# Patient Record
Sex: Female | Born: 1985 | Race: White | Hispanic: No | Marital: Married | Smoking: Never smoker
Health system: Southern US, Community
[De-identification: ages and names within clinical notes are randomized; demographics above are authoritative.]

## PROBLEM LIST (undated history)

## (undated) DIAGNOSIS — Z1501 Genetic susceptibility to malignant neoplasm of breast: Secondary | ICD-10-CM

## (undated) DIAGNOSIS — F419 Anxiety disorder, unspecified: Secondary | ICD-10-CM

## (undated) DIAGNOSIS — Z1509 Genetic susceptibility to other malignant neoplasm: Secondary | ICD-10-CM

## (undated) DIAGNOSIS — N943 Premenstrual tension syndrome: Secondary | ICD-10-CM

## (undated) DIAGNOSIS — F32A Depression, unspecified: Secondary | ICD-10-CM

## (undated) DIAGNOSIS — Z3490 Encounter for supervision of normal pregnancy, unspecified, unspecified trimester: Secondary | ICD-10-CM

## (undated) DIAGNOSIS — A630 Anogenital (venereal) warts: Principal | ICD-10-CM

## (undated) HISTORY — PX: BREAST BIOPSY: SHX20

## (undated) HISTORY — DX: Anxiety disorder, unspecified: F41.9

## (undated) HISTORY — DX: Premenstrual tension syndrome: N94.3

## (undated) HISTORY — PX: WISDOM TOOTH EXTRACTION: SHX21

## (undated) HISTORY — DX: Genetic susceptibility to other malignant neoplasm: Z15.09

## (undated) HISTORY — DX: Encounter for supervision of normal pregnancy, unspecified, unspecified trimester: Z34.90

## (undated) HISTORY — DX: Anogenital (venereal) warts: A63.0

## (undated) HISTORY — DX: Genetic susceptibility to malignant neoplasm of breast: Z15.01

## (undated) HISTORY — PX: OTHER SURGICAL HISTORY: SHX169

---
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
## 2005-01-19 ENCOUNTER — Other Ambulatory Visit: Admission: RE | Admit: 2005-01-19 | Discharge: 2005-01-19 | Payer: Self-pay | Admitting: Gynecology

## 2006-01-22 ENCOUNTER — Other Ambulatory Visit: Admission: RE | Admit: 2006-01-22 | Discharge: 2006-01-22 | Payer: Self-pay | Admitting: Gynecology

## 2007-02-11 ENCOUNTER — Other Ambulatory Visit: Admission: RE | Admit: 2007-02-11 | Discharge: 2007-02-11 | Payer: Self-pay | Admitting: Gynecology

## 2008-02-21 ENCOUNTER — Other Ambulatory Visit: Admission: RE | Admit: 2008-02-21 | Discharge: 2008-02-21 | Payer: Self-pay | Admitting: Gynecology

## 2009-03-11 ENCOUNTER — Other Ambulatory Visit: Admission: RE | Admit: 2009-03-11 | Discharge: 2009-03-11 | Payer: Self-pay | Admitting: Gynecology

## 2009-03-11 ENCOUNTER — Ambulatory Visit: Payer: Self-pay | Admitting: Women's Health

## 2009-03-11 ENCOUNTER — Encounter: Payer: Self-pay | Admitting: Women's Health

## 2009-06-17 ENCOUNTER — Ambulatory Visit: Payer: Self-pay | Admitting: Women's Health

## 2010-03-14 ENCOUNTER — Other Ambulatory Visit: Admission: RE | Admit: 2010-03-14 | Discharge: 2010-03-14 | Payer: Self-pay | Admitting: Gynecology

## 2010-03-14 ENCOUNTER — Ambulatory Visit: Payer: Self-pay | Admitting: Women's Health

## 2011-03-26 ENCOUNTER — Ambulatory Visit (INDEPENDENT_AMBULATORY_CARE_PROVIDER_SITE_OTHER): Payer: BC Managed Care – PPO | Admitting: Women's Health

## 2011-03-26 DIAGNOSIS — N912 Amenorrhea, unspecified: Secondary | ICD-10-CM

## 2011-05-15 ENCOUNTER — Other Ambulatory Visit: Payer: Self-pay | Admitting: Women's Health

## 2011-05-15 ENCOUNTER — Other Ambulatory Visit (HOSPITAL_COMMUNITY)
Admission: RE | Admit: 2011-05-15 | Discharge: 2011-05-15 | Disposition: A | Discharge status: Home / Self Care | Payer: BC Managed Care – PPO | Source: Ambulatory Visit | Attending: Gynecology | Admitting: Gynecology

## 2011-05-15 ENCOUNTER — Encounter (INDEPENDENT_AMBULATORY_CARE_PROVIDER_SITE_OTHER): Payer: BC Managed Care – PPO | Admitting: Women's Health

## 2011-05-15 DIAGNOSIS — Z124 Encounter for screening for malignant neoplasm of cervix: Secondary | ICD-10-CM | POA: Insufficient documentation

## 2011-05-15 DIAGNOSIS — Z01419 Encounter for gynecological examination (general) (routine) without abnormal findings: Secondary | ICD-10-CM

## 2011-05-15 DIAGNOSIS — Z3049 Encounter for surveillance of other contraceptives: Secondary | ICD-10-CM

## 2011-09-29 DIAGNOSIS — N943 Premenstrual tension syndrome: Secondary | ICD-10-CM | POA: Insufficient documentation

## 2011-09-29 DIAGNOSIS — G43909 Migraine, unspecified, not intractable, without status migrainosus: Secondary | ICD-10-CM | POA: Insufficient documentation

## 2011-10-02 ENCOUNTER — Encounter: Payer: Self-pay | Admitting: Women's Health

## 2011-10-02 ENCOUNTER — Ambulatory Visit (INDEPENDENT_AMBULATORY_CARE_PROVIDER_SITE_OTHER): Payer: BC Managed Care – PPO | Admitting: Women's Health

## 2011-10-02 DIAGNOSIS — IMO0001 Reserved for inherently not codable concepts without codable children: Secondary | ICD-10-CM

## 2011-10-02 DIAGNOSIS — N390 Urinary tract infection, site not specified: Secondary | ICD-10-CM

## 2011-10-02 DIAGNOSIS — R350 Frequency of micturition: Secondary | ICD-10-CM

## 2011-10-02 MED ORDER — SULFAMETHOXAZOLE-TRIMETHOPRIM 800-160 MG PO TABS: 1.0000 | ORAL_TABLET | Freq: Two times a day (BID) | ORAL | Status: AC

## 2011-10-02 NOTE — Progress Notes (Signed)
  Presents with a complaint of increased urinary frequency, urgency and slight pain at the end of the stream of urination. Denies any discharge or fever. Also would like to become pregnant. No contraception since May. Fertility awareness was reviewed, reviewed importance of increasing frequency, rubella immune. She is on MVI, as is aware of safe behavior in pregnancy.  UA- TNTC WBCs.  History of a normal TSH and prolactin. 28-30 day cycle for 5 days  Plan: Septra DS one by mouth twice a day for 3 days, UTI prevention discussed. Return to office with missed cycle for viability ultrasound, if does not conceive in next 2-3 months check SA, if normal hysterosalpingogram.

## 2011-12-07 ENCOUNTER — Telehealth: Payer: Self-pay | Admitting: *Deleted

## 2011-12-07 DIAGNOSIS — Z3169 Encounter for other general counseling and advice on procreation: Secondary | ICD-10-CM

## 2011-12-07 NOTE — Telephone Encounter (Signed)
Telephone call, reviewed plan of action. Will check a estradiol, prolactin, FSH, on day 3 of next cycle will call office to schedule. Will also check a progesterone level on day 22 -25. Will also get a  SA./ Semen analysis.  Jen-please put above labs in computer. Thanks

## 2011-12-07 NOTE — Telephone Encounter (Signed)
Pt called saying that she has not conceived yet. Pt was last seen on 10/02/11 and was told if not pregnant in 2-3 months husband will be checked for SA? Should we set up for infertility? Please advise

## 2011-12-07 NOTE — Telephone Encounter (Signed)
Telephone call left message we will do lab work Day  3 of cycle- estradiol, FSH and prolactin she does have a normal TSH. Also we'll do a progesterone level day 22-25 of her cycle. Instructed to call so we can get  Scheduled. if this is normal we'll proceed with a SA.

## 2011-12-07 NOTE — Telephone Encounter (Signed)
Pt called saying that she has not conceived yet. Pt was last seen on 10/02/11 and was told if not pregnant in 2-3 months husband will be checked for SA? Should we set up for infertility? Please advise 

## 2011-12-08 NOTE — Telephone Encounter (Signed)
Addended by: Aura Camps on: 12/08/2011 08:38 AM   Modules accepted: Orders

## 2011-12-28 ENCOUNTER — Telehealth: Payer: Self-pay | Admitting: *Deleted

## 2011-12-28 NOTE — Telephone Encounter (Signed)
Telephone call, last cycle started on Thursday, we'll wait until next month and come in on day 3 for an estradiol and FSH. We'll pick up kit and schedule appointment for her husband to get a SA done at [facility] on [address].

## 2011-12-28 NOTE — Telephone Encounter (Signed)
(  see telephone encounter 12/07/11) Pt was to call on day 3 of next cycle which came on Friday 12/25/11. But pt had only spotting, her full flow was on Saturday but today she has light bleeding again, no flow. Pt is okay with waiting until next cycle. She also would like to know if her husband should still get his S.A/Semen Analysis done or wait until her cycle comes next month? Please advise

## 2012-01-21 ENCOUNTER — Telehealth: Payer: Self-pay | Admitting: *Deleted

## 2012-01-21 NOTE — Telephone Encounter (Signed)
(  F/U from telephone encounter 12/02/11) pt called and said that her period started yesterday and Friday will be there 3 rd. She is not having a heavy flow but it is very light not spotting but very light. She states this is the time for her period and normally her period would become somewhat  heavier on her 3 rd day of cycle. Pt said she thinks this may happen every month with her periods. Should pt come to have labs drawn as requested? Please advise

## 2012-01-21 NOTE — Telephone Encounter (Signed)
Telephone call, day 3 of bleeding will come to office on 01/22/12 for [facility] and estradiol.

## 2012-01-21 NOTE — Telephone Encounter (Signed)
Pt called stating that her day 3 period will be on Friday. Pt wanted to to know if okay to have labs drawn per telephone encounter 12/28/11 okay to do this. Left message on pt vm to make appointment to have labs drawn.

## 2012-01-22 ENCOUNTER — Other Ambulatory Visit: Payer: BC Managed Care – PPO

## 2012-01-22 DIAGNOSIS — Z3169 Encounter for other general counseling and advice on procreation: Secondary | ICD-10-CM

## 2012-01-22 LAB — ESTRADIOL, FREE

## 2012-02-03 ENCOUNTER — Encounter: Payer: Self-pay | Admitting: Women's Health

## 2012-02-12 ENCOUNTER — Other Ambulatory Visit: Payer: Self-pay | Admitting: *Deleted

## 2012-02-12 ENCOUNTER — Other Ambulatory Visit: Payer: BC Managed Care – PPO

## 2012-02-12 DIAGNOSIS — N926 Irregular menstruation, unspecified: Secondary | ICD-10-CM

## 2012-02-13 LAB — PROGESTERONE: Progesterone: 8.6 ng/mL

## 2012-02-17 ENCOUNTER — Encounter: Payer: Self-pay | Admitting: Women's Health

## 2012-02-18 ENCOUNTER — Other Ambulatory Visit: Payer: Self-pay | Admitting: Women's Health

## 2012-02-18 NOTE — Progress Notes (Signed)
Telephone call to review semen analysis, informed results similar to first semen analysis with low sperm count, low motility, encouraged urology consult for husband. Will also schedule appointment with Dr. April Manson for fertility management. Instructed to call when appointment is made so lab results can be faxed.

## 2012-04-20 ENCOUNTER — Telehealth: Payer: Self-pay | Admitting: *Deleted

## 2012-04-20 NOTE — Telephone Encounter (Signed)
Pt saw dr Dyke Brackett and her request pt have fsh, amh, cystic fibrosis lab drawn at [facility], pt asked if labs could be done here. Pt informed that we use [facility] as well.

## 2012-05-18 ENCOUNTER — Encounter: Payer: Self-pay | Admitting: Women's Health

## 2012-05-18 ENCOUNTER — Ambulatory Visit (INDEPENDENT_AMBULATORY_CARE_PROVIDER_SITE_OTHER): Payer: BC Managed Care – PPO | Admitting: Women's Health

## 2012-05-18 VITALS — BP 150/94 | Ht 67.0 in | Wt 154.0 lb

## 2012-05-18 DIAGNOSIS — N469 Male infertility, unspecified: Secondary | ICD-10-CM | POA: Insufficient documentation

## 2012-05-18 DIAGNOSIS — Z01419 Encounter for gynecological examination (general) (routine) without abnormal findings: Secondary | ICD-10-CM

## 2012-05-18 DIAGNOSIS — N979 Female infertility, unspecified: Secondary | ICD-10-CM

## 2012-05-18 MED ORDER — ALPRAZOLAM 0.25 MG PO TABS: 0.2500 mg | ORAL_TABLET | Freq: Every evening | ORAL | Status: DC | PRN

## 2012-05-18 NOTE — Progress Notes (Signed)
Leah Davis [birth year] [id number]    History:    The patient presents for annual exam.  Monthly 3-4 day cycle desiring conception. Husband with low sperm count and low motility. Is being seen by Dr. April Manson for fertility management. History of normal Paps. Gardasil series completed in 2007. Rubella +2012. History of anxiety, uses Xanax 0.25 on rare occasion. History of migraines without aura.  Past medical history, past surgical history, family history and social history were all reviewed and documented in the EPIC chart. Mother breast cancer in her 66s doing well maternal grandmother breast cancer 26s, BRCA testing reviewed, unsure if mother had done.  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   ROS:  A  ROS was performed and pertinent positives and negatives are included in the history.  Exam:  Filed Vitals:   05/18/12 0956  BP: 150/94    General appearance:  Normal Head/Neck:  Normal, without cervical or supraclavicular adenopathy. Thyroid:  Symmetrical, normal in size, without palpable masses or nodularity. Respiratory  Effort:  Normal  Auscultation:  Clear without wheezing or rhonchi Cardiovascular  Auscultation:  Regular rate, without rubs, murmurs or gallops  Edema/varicosities:  Not grossly evident Abdominal  Soft,nontender, without masses, guarding or rebound.  Liver/spleen:  No organomegaly noted  Hernia:  None appreciated  Skin  Inspection:  Grossly normal  Palpation:  Grossly normal Neurologic/psychiatric  Orientation:  Normal with appropriate conversation.  Mood/affect:  Normal  Genitourinary    Breasts: Examined lying and sitting.     Right: Without masses, retractions, discharge or axillary adenopathy.     Left: Without masses, retractions, discharge or axillary adenopathy.   Inguinal/mons:  Normal without inguinal adenopathy  External genitalia:  Normal  BUS/Urethra/Skene's glands:  Normal  Bladder:  Normal  Vagina:  Normal  Cervix:  Normal  Uterus:    normal in size, shape and contour.  Midline and mobile  Adnexa/parametria:     Rt: Without masses or tenderness.   Lt: Without masses or tenderness.  Anus and perineum: Normal  Digital rectal exam: Normal sphincter tone without palpated masses or tenderness  Assessment/Plan:  26 y.o. M. WF GC for annual exam.     Infertility-Dr. April Manson Situational anxiety  Plan: Continue followup for fertility management, prenatal vitamin daily, SBE's, starting annual mammogram at 26. Exercise, safe behaviors in pregnancy reviewed. Had numerous labs drawn for fertility, no labs today history of all normal Paps, no Pap done, new Pap screening recommendations reviewed. Xanax 0.25 at bedtime when necessary ,#30, reviewed not to use with pregnancy. Uses rarely, is aware of addictive properties.    Harrington Challenger WHNP, 1:00 PM 05/18/2012

## 2012-05-24 ENCOUNTER — Telehealth: Payer: Self-pay

## 2012-05-24 NOTE — Telephone Encounter (Signed)
PT. CALLED BACK A SECOND TIME AND STATES NOW MOM IS GETTING BRACA TESTING AND Lachlan WANTS TO KNOW IF SHE NEEDS TO DO BRACA TESTING AFTER MOM GETS HER RESULTS CAN SHE GET IT DONE AT OUR OFFICE.

## 2012-05-24 NOTE — Telephone Encounter (Signed)
AFTER SEEING YOU RECENTLY SHE DECIDED NOT TO SEE A M.D. AT [facility] DUE TO HAVING TO TAKE TOO MUCH TIME OFF AND WANTED TO KNOW IF SHE COULD HAVE BRACA TESTING FIRST SO HER MOM WON'T HAVE TO PAY FOR THE TESTING. SHE ALSO STATES DR. YACINKAYA DIDN'T CARE WHERE SHE HAD TESTS DONE BUT JUST AS LONG AS SHE GETS TESTED.

## 2012-05-25 NOTE — Telephone Encounter (Signed)
Pt informed with the below note, pt will have dr. April Manson office have her [facility] testing done.

## 2012-05-25 NOTE — Telephone Encounter (Signed)
Please call, After mother has BRCA testing results, review with Dr. April Manson. BRCA testing can be done here. Remind her she needs a lab appointment.

## 2012-05-30 DIAGNOSIS — A630 Anogenital (venereal) warts: Secondary | ICD-10-CM

## 2012-05-30 HISTORY — DX: Anogenital (venereal) warts: A63.0

## 2012-06-09 ENCOUNTER — Telehealth: Payer: Self-pay | Admitting: *Deleted

## 2012-06-09 NOTE — Telephone Encounter (Signed)
Pt called saying skin tag has changed and became somewhat bigger, left message on voicemail nancy out of office until 04/15/12 call back next week to make ov to check.

## 2012-06-10 ENCOUNTER — Ambulatory Visit (INDEPENDENT_AMBULATORY_CARE_PROVIDER_SITE_OTHER): Payer: BC Managed Care – PPO | Admitting: Gynecology

## 2012-06-10 ENCOUNTER — Encounter: Payer: Self-pay | Admitting: Gynecology

## 2012-06-10 DIAGNOSIS — N9489 Other specified conditions associated with female genital organs and menstrual cycle: Secondary | ICD-10-CM

## 2012-06-10 DIAGNOSIS — N9089 Other specified noninflammatory disorders of vulva and perineum: Secondary | ICD-10-CM

## 2012-06-10 NOTE — Patient Instructions (Signed)
Office will call you with pathology results. Call us if you have any issues.

## 2012-06-10 NOTE — Progress Notes (Signed)
Patient ID: Leah Davis, female   DOB: [birth year], 26 y.o.   MRN: [id number] Patient presents with history of right vulvar skin lesion. Has been present for a long time seems to get bigger and smaller. Not overly bothersome.  Exam with Sherrilyn Rist assistant External BUS vagina with pedunculated mid right labial epithelial polyp benign in appearance. Remainder of vulva normal. Vagina without lesions. Cervix normal. Uterus normal size midline mobile nontender. Adnexa without masses or tenderness. Physical Exam  Genitourinary:      Discussed observation versus excision and the patient prefers to have the area excised.  Procedure:  Surrounding/overlying skin cleansed with Betadine solution, infiltrated with 1% lidocaine and the lesion was excised in its entirety at the level of surrounding labial skin and sent to pathology. Silver nitrate applied afterwards.  Assessment and plan: Persistent right labial pedunculated lesion, benign in appearance. Patient will follow up for pathology results. Assuming benign then we'll follow expectantly.

## 2012-06-14 ENCOUNTER — Telehealth: Payer: Self-pay | Admitting: Gynecology

## 2012-06-14 NOTE — Telephone Encounter (Signed)
Patient called back.  She had pap smear last year but when she came this year she did not because recommendation was for pap q 3 years because her pap smears have always been normal.  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

## 2012-06-14 NOTE — Telephone Encounter (Signed)
Patient was informed earlier this afternoon that her biopsy report showed "a benign condyloma" , "a benign wart".  She has called back and would like to know what causes it.  I told her caused by the HPV virus and usually sexually transmitted.  She asked "so I got this from my husband?".  At this point, I told her I wanted to be sure I answered things the proper way and gave her good info so to let me check with Dr. Velvet Bathe for how to answer these questions.

## 2012-06-14 NOTE — Telephone Encounter (Signed)
Patient informed. Reassured.

## 2012-06-14 NOTE — Telephone Encounter (Signed)
These are viral related as warts are on any part of the body. When they are genital they are usually sexually transmitted which could have occurred years ago and do not necessarily imply recent exposure. There is no way to tell when exposure occurred. Nothing further needs to be done other than if other bumps appear to be reevaluated. On study where asymptomatic, no warts college girls were checked for the virus, 70% had the virus. So it is very common.

## 2012-06-15 NOTE — Telephone Encounter (Signed)
Patient advised and was agreeable to this.  Appt scheduled for Friday, July 19 10:00am.

## 2012-06-15 NOTE — Telephone Encounter (Signed)
The patient has a lot of questions I think are better answered face-to-face. Have the patient make an appointment to come talk to me.

## 2012-06-15 NOTE — Telephone Encounter (Signed)
Patient had called back today because she had not heard from me back about her last question below regarding should she repeat pap smear now.  Also, since then she has been on the internet and she sees there are different types of the virus and she wonders what "brand" her's is?

## 2012-06-17 ENCOUNTER — Ambulatory Visit (INDEPENDENT_AMBULATORY_CARE_PROVIDER_SITE_OTHER): Payer: BC Managed Care – PPO | Admitting: Gynecology

## 2012-06-17 ENCOUNTER — Encounter: Payer: Self-pay | Admitting: Gynecology

## 2012-06-17 DIAGNOSIS — A630 Anogenital (venereal) warts: Secondary | ICD-10-CM

## 2012-06-17 NOTE — Progress Notes (Signed)
Patient presents to discuss her recent vulvar biopsy which showed condyloma acuminata. Patient had many questions about this and was asked to come in to discuss face-to-face. She had received the Gardasil vaccine.  Exam is consistent External with biopsy site healed. No other evidence of other lesions.  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 I reviewed with the patient it was not classic in appearance and the first possibility is that this was an overcall by the pathologist. Regardless would not change treatment which is monitoring at present. I reviewed the whole issue of condyloma acuminata, HPV relationship, natural history of HPV infection. She understands that this does not necessarily represent a recent exposure that this could have been present from prior exposures years ago. Recommend self exams and at any other lesions appear to come in for evaluation. Also that she should continue with routine Pap smears and that this should not change Pap smear screening frequency or type of screening.  She did receive the Gardasil vaccine but understands that it covers for subtypes and that there are many other subtypes the couldn't potentially cause infection. Patient will monitor and as long as no other lesions noted. She'll represent in a year for her annual exam.

## 2012-06-17 NOTE — Patient Instructions (Signed)
Call if you have any further questions. Otherwise follow up in one year for your annual exam.

## 2012-07-08 ENCOUNTER — Ambulatory Visit (HOSPITAL_BASED_OUTPATIENT_CLINIC_OR_DEPARTMENT_OTHER): Payer: BC Managed Care – PPO | Admitting: Genetic Counselor

## 2012-07-08 DIAGNOSIS — Z803 Family history of malignant neoplasm of breast: Secondary | ICD-10-CM

## 2012-07-08 DIAGNOSIS — IMO0002 Reserved for concepts with insufficient information to code with codable children: Secondary | ICD-10-CM

## 2012-07-08 DIAGNOSIS — Z8481 Family history of carrier of genetic disease: Secondary | ICD-10-CM

## 2012-07-15 ENCOUNTER — Encounter: Payer: Self-pay | Admitting: Genetic Counselor

## 2012-07-15 ENCOUNTER — Other Ambulatory Visit: Payer: BC Managed Care – PPO | Admitting: Lab

## 2012-07-15 NOTE — Progress Notes (Addendum)
Leah Davis, a 26 y.o. female, came in with her mother for discussion of her mother's recent diagnosis of a BRCA1 mutation. She presents to clinic today to discuss the possibility of a genetic predisposition to cancer, and to further clarify her risks, as well as her family members' risks for cancer.   HISTORY OF PRESENT ILLNESS: Leah Davis is a 26 y.o. female with no personal history of cancer.    Past Medical History  Diagnosis Date  . Migraine     WITHOUT AURA  . PMS (premenstrual syndrome)   . 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    Past Surgical History  Procedure Date  . Wisdom tooth extraction     History  Substance Use Topics  . Smoking status: Never Smoker   . Smokeless tobacco: Never Used  . Alcohol Use: No    FAMILY HISTORY:  We obtained a detailed, 4-generation family history.  Significant diagnoses are listed below: Family History  Problem Relation Age of Onset  . Breast cancer Mother 5  . Breast cancer Maternal Grandmother     diagnosed age 32-44  The patient has not been diagnosed with breast cancer; her mother was diagnosed with breast cancer in 2001 at age 63. She has a healthy uncle who has two daughters. Her grandmother was diagnosed with breast cancer around age 26-44 and died at age 48. The patient has a maternal great uncle in his 72s, who had two sons who are healthy. The patient's maternal great grandparents are deceased, but died of other things than cancer. There is no other reported cancer history.   Patient's maternal ancestors are of [place] descent and the paternal ancestors are of unknown descent. There is reported Ashkenazi Jewish ancestry. There is no known consanguinity.   GENETIC COUNSELING RISK ASSESSMENT, DISCUSSION, AND SUGGESTED FOLLOW UP:  We reviewed the natural history and genetic etiology of sporadic, familial and hereditary cancer syndromes.  About 5-10% of breast cancer is hereditary.  Of this, about  85% is the result of a BRCA1 or BRCA2 mutation.  We reviewed the red flags of hereditary cancer syndromes and the dominant inheritance patterns. The patient's mother was found to have a BRCA1 mutation that is commonly found in the Jewish population. Women with this mutation are at an increased risk for breast, and ovarian cancer. Men are at an increased risk for breast, and prostate cancer. Both genders have an increased risk for pancreatic cancer. The patient and her siblings, as well as her uncle are at a 50% chance of carrying the same mutation, and therefore each individual should be offered testing. Her great uncle is also at a 50% chance of carrying the mutation. We discussed that if Leah Davis is found to carry a BRCA1 mutation, she will need to be placed on a high risk breast cancer screening protocol that would include mammograms, possible MRI and clinical breast exams. This could be performed through the high risk breast clinic at [facility].  We also discussed the possibility of a double mastectomy, and the recommendation of removing her ovaries once she has completed her family.  The patient stated her understanding, and scheduled an appointment on July 15, 2012 at 9 AM to have her blood drawn for the known familial BRCA1 mutation.  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 This plan is being carried out per the patient's request. This note will also be sent to the referring provider via the electronic medical record.  The patient will be supplied with a summary of this genetic counseling discussion as well as educational information on the discussed hereditary cancer syndromes following the conclusion of their visit.   Patient was discussed with Dr. Drue Second.    _______________________________________________________________________ For Office Staff:  Number of people involved in session: 3 Was an Intern/ student involved with case: not applicable

## 2012-07-28 ENCOUNTER — Encounter: Payer: Self-pay | Admitting: Genetic Counselor

## 2012-07-29 ENCOUNTER — Telehealth: Payer: Self-pay | Admitting: Genetic Counselor

## 2012-07-29 NOTE — Telephone Encounter (Signed)
Revealed BRCA2 mutation found that was seen in mother.  We scheduled an appointment for Friday 9/6 at 3:30 to come in and talk about screening.

## 2012-08-05 ENCOUNTER — Ambulatory Visit (HOSPITAL_BASED_OUTPATIENT_CLINIC_OR_DEPARTMENT_OTHER): Payer: BC Managed Care – PPO | Admitting: Genetic Counselor

## 2012-08-05 DIAGNOSIS — Z1501 Genetic susceptibility to malignant neoplasm of breast: Secondary | ICD-10-CM

## 2012-08-08 ENCOUNTER — Encounter: Payer: Self-pay | Admitting: Genetic Counselor

## 2012-08-08 NOTE — Progress Notes (Signed)
   Leah Davis, a 26 y.o. female, for a discussion regarding the finding of a BRCA1 mutation. She presents to clinic today, with her husband, to discuss her genetic predisposition to cancer, and to further clarify her risks, as well as her family members' risks for cancer.   HISTORY OF PRESENT ILLNESS: Leah Davis is a 26 y.o. female with no personal history of cancer.    Past Medical History  Diagnosis Date  . Migraine     WITHOUT AURA  . PMS (premenstrual syndrome)   . 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    Past Surgical History  Procedure Date  . Wisdom tooth extraction     History  Substance Use Topics  . Smoking status: Never Smoker   . Smokeless tobacco: Never Used  . Alcohol Use: No    FAMILY HISTORY:  We obtained a detailed, 4-generation family history.  Significant diagnoses are listed below: Family History  Problem Relation Age of Onset  . Breast cancer Mother 91  . Breast cancer Maternal Grandmother     diagnosed age 21-44  The patient's grandmother was diagnosed with breast cancer around age 71-44, and her mother was diagnosed with breast cancer at age 65.  The patient's mother was found to carry a BRCA1 mutation.  Patient's maternal ancestors are of [place] and [place] descent, and paternal ancestors are of unknown descent. There is reported Ashkenazi Jewish ancestry. There is no known consanguinity.  GENETIC COUNSELING RISK ASSESSMENT, DISCUSSION, AND SUGGESTED FOLLOW UP: We reviewed the natural history and genetic etiology of sporadic, familial and hereditary cancer syndromes.  The patient was found to carry a BRCA1 mutation, that is common in the jewish population.  We discussed the increased risk for different cancers, including breast, ovarian and pancreatic cancer.  We discussed increased surveillance for breast cancer and the option of prophylactic mastectomy and oophorectomy.  She was referred to the high risk clinic to start  discussing high risk breast screening and begin the screening.  The patient's family history is suggestive of the following possible diagnosis: Hereditary Breast and Ovarian Cancer Syndrome  Based on the patient's personal and family history, statistical models (Tyrer Cusik)  and literature data were used to estimate her risk of developing breast cancer. This estimates her lifetime risk of developing breast cancer to be approximately 84.5%. This estimation does take into account any genetic testing results.   The patient was seen for a total of 30 minutes, greater than 50% of which was spent face-to-face counseling.   This note will also be sent to Ms. Aird's GYN provider via the electronic medical record. The patient will be supplied with a summary of this genetic counseling discussion as well as educational information on the discussed hereditary cancer syndromes following the conclusion of their visit.   Patient was discussed with Dr. Drue Second.  CC EPIC: Maryelizabeth Rowan, NP, [facility] Gynecology _______________________________________________________________________ For Office Staff:  Number of people involved in session: 3 Was an Intern/ student involved with case: not applicable

## 2012-08-10 ENCOUNTER — Encounter: Payer: Self-pay | Admitting: Genetic Counselor

## 2012-08-10 ENCOUNTER — Telehealth: Payer: Self-pay | Admitting: *Deleted

## 2012-08-10 ENCOUNTER — Encounter: Payer: Self-pay | Admitting: Women's Health

## 2012-08-10 DIAGNOSIS — Z1501 Genetic susceptibility to malignant neoplasm of breast: Secondary | ICD-10-CM | POA: Insufficient documentation

## 2012-08-10 DIAGNOSIS — Z1509 Genetic susceptibility to other malignant neoplasm: Secondary | ICD-10-CM

## 2012-08-10 NOTE — Telephone Encounter (Signed)
Telephone call to review BRCA 1 positive status. Recommendations were to do a MRI, 3 months later mammogram, 3 months later physical exam and then repeat the cycle. Mother and maternal grandmother had breast cancer in early 25s. She is currently trying to conceive. Dr. Jamse Arn has only offered IVF as an option. Has consult today with Dr. Elesa Hacker for second opinion.

## 2012-08-10 NOTE — Telephone Encounter (Signed)
Pt called to let you know she was positive for BRCA 1 testing. Notes are in epic to review.

## 2012-08-11 ENCOUNTER — Telehealth: Payer: Self-pay | Admitting: *Deleted

## 2012-08-11 NOTE — Telephone Encounter (Signed)
Pt has update from last telephone encounter, asked you to call her at [phone] after 4 pm today. 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

## 2012-08-11 NOTE — Telephone Encounter (Signed)
Telephone call to review consult visit with Dr. Elesa Hacker. Reviewed odds of conceiving with IVF are low and would put her at increased risk for breast cancer. Both mother and maternal grandmother with breast cancer in their 53s. Also BRCA 1 positive.

## 2012-08-22 ENCOUNTER — Telehealth: Payer: Self-pay | Admitting: *Deleted

## 2012-08-30 ENCOUNTER — Telehealth: Payer: Self-pay | Admitting: *Deleted

## 2012-08-30 NOTE — Telephone Encounter (Signed)
Confirmed 09/30/12 appt w/ pt.

## 2012-08-31 NOTE — Telephone Encounter (Signed)
Patient called w/ dates that she was off work and could come in.  Told her that I would go to Dr. Welton Flakes and see what we could do and call her back.

## 2012-09-30 ENCOUNTER — Encounter: Payer: Self-pay | Admitting: Oncology

## 2012-09-30 ENCOUNTER — Ambulatory Visit (HOSPITAL_BASED_OUTPATIENT_CLINIC_OR_DEPARTMENT_OTHER): Payer: BC Managed Care – PPO | Admitting: Oncology

## 2012-09-30 ENCOUNTER — Ambulatory Visit (HOSPITAL_BASED_OUTPATIENT_CLINIC_OR_DEPARTMENT_OTHER): Payer: BC Managed Care – PPO

## 2012-09-30 VITALS — BP 167/96 | HR 116 | Temp 97.4°F | Resp 20 | Ht 66.0 in | Wt 159.9 lb

## 2012-09-30 DIAGNOSIS — Z1231 Encounter for screening mammogram for malignant neoplasm of breast: Secondary | ICD-10-CM

## 2012-09-30 DIAGNOSIS — Z1509 Genetic susceptibility to other malignant neoplasm: Secondary | ICD-10-CM

## 2012-09-30 DIAGNOSIS — Z803 Family history of malignant neoplasm of breast: Secondary | ICD-10-CM

## 2012-09-30 DIAGNOSIS — Z1501 Genetic susceptibility to malignant neoplasm of breast: Secondary | ICD-10-CM

## 2012-09-30 NOTE — Progress Notes (Signed)
Checked in new patient. No financial issues. Patient was really nervous.

## 2012-09-30 NOTE — Patient Instructions (Addendum)
Schedule MRI/Mommograms  I will see you back in 3 months

## 2012-09-30 NOTE — Progress Notes (Signed)
[facility]  High Risk Clinic New Patient Evaluation  Name: Leah Davis            Date: 09/30/2012 MRN: [id number]                DOB: [birth year]  CC:  Dr. Lezlie Lye, NP  REFERRING PHYSICIAN: Maryelizabeth Rowan NP [facility] gynecology  REASON FOR VISIT: 26 year old female who is seen for discussion regarding BRCA1 mutation results and risk reduction for breast and ovarian cancers.  HISTORY OF PRESENT ILLNESS: Leah Davis is a 26 y.o. female here for discussion of hereditary cancers including breast and ovarian. She has a family history for grandmother diagnosed with breast cancer at the age of 7-44 her mother was diagnosed with breast cancer at 55. Her mother was found to be BRCA1 mutation carrier. Patient therefore was tested as well and she T. was found to be positive for BRCA1 mutation. She was recently seen in our genetics clinic for discussion of genetic predisposition to cancer and to clarify her risks as well as her family's risk for cancers. Patient also is premenopausal and has a history of infertility. Her husband also has ongoing workup for infertility. Patient and her husband are interested in starting the family. They have met with several infertility specialists. Because of this mutation of BRCA1 she was recommended not to undergo fertility treatments due to her risk and exposure to infertility medications. Initially apparently patient was quite upset as was her husband. But at after extensive discussions between the 2 of them they have opted not to undergo fertility treatments but instead to try to conceive spontaneously/naturally. Clinically she is she is without any complaints she is denying any nausea vomiting fevers. She does do self breast examinations. She is very health conscious. She and her husband exercise and eat healthy and carry on a very healthy lifestyle.  PAST MEDICAL HISTORY:  has a past medical history of  Migraine; PMS (premenstrual syndrome); and Condyloma acuminata (05/2012).  PAST SURGICAL HISTORY:  Past Surgical History  Procedure Date  . Wisdom tooth extraction       CURRENT MEDICATIONS: Ms. Worman does not currently have medications on file.  ALLERGIES: Review of patient's allergies indicates no known allergies.  SOCIAL HISTORY:  reports that she has never smoked. She has never used smokeless tobacco. She reports that she does not drink alcohol or use illicit drugs.  HEALTH HABITS: Vitamins:yes Supplements:none Alternative Therapies:none Adverse environmental exposure: none Servings of fruit and vegetables/day: 2-3 Servings of meat/day:1 (chicken) Exercises regularly:   1/week               Smoker/nonsmoker: none Alcohol: 1/day Number of alcoholic beverages/week: 7/week  REPRODUCTIVE HISTORY:  Menarche age: 72 Gravida: 0   Para: 0 First Live Birth:N/A Number of live births: N/A Breast fed: Y/N  # months N/A Took fertility meds:  none                   Menses: regular Oral Contraceptives:      none   # of years Menopause: natural/surgical  Age N/A HRT Y/N Currently Y/N TType:         N/A                    Sexually transmitted disease:    FAMILY HISTORY:  family history includes Breast cancer in her maternal grandmother and Breast cancer (age of onset:42) in her mother.  HEALTH  MAINTENANCE: Last mammogram: none Last clinical breast exam: 4/13 Performs self breast exam: none Last Pap Smear: 2012 Colonoscopy: none Last skin exam:  REVIEW OF SYSTEMS:  General: Negative for fever, chills, night sweats,  loss of appetite or weight loss. HEENT: Negative for headaches, sore  throat, difficulty swallowing, blurred vision or problem with hearing or  sinus congestion. Respiratory: Negative for shortness of breath, cough  or dyspnea on exertion. Cardiovascular: Negative for chest pain,  palpitations or pedal edema. GI: Negative for nausea, vomiting,  diarrhea,  constipation, change in bowel habits or blood in the stool.  No jaundice. GU: Negative for painful or frequent urination, change in  color of urine, or decreased urinary stream. Integumentary: Negative  for skin rashes or other suspicious skin lesions. Hematologic: Negative  for easy bruisability or bleeding. Musculoskeletal: Negative for  complaints of pain, arthralgias, arthritis or myalgias.  Neurological/psychiatric: Negative for numbness, focal weakness,  balance problems or coordination difficulties. No depression or mood swings.  Breast: No self detected abnormalities in the breast. No nipple discharge, masses or redness of the skin.   PHYSICAL EXAM: BP 167/96  Pulse 116  Temp 97.4 F (36.3 C) (Oral)  Resp 20  Ht 5\' 6"  (1.676 m)  Wt 159 lb 14.4 oz (72.53 kg)  BMI 25.81 kg/m2 GENERAL: Well developed, well nourished, in no acute distress.  EENT: No ocular or oral lesions. No stomatitis.  RESPIRATORY: Lungs are clear to auscultation bilaterally with normal respiratory movement and no accessory muscle use. CARDIAC: No murmur, rub or tachycardia. No upper or lower extremity edema.  GI: Abdomen is soft, no palpable hepatosplenomegaly. No fluid wave. No tenderness. Musculoskeletal: No kyphosis, no tenderness over the spine, ribs or hips. Lymph: No cervical, infraclavicular, axillary or inguinal adenopathy. Neuro: No focal neurological deficits. Psych: Alert and oriented X 3, appropriate mood and affect.  BREAST EXAM: In the supine position, with the right arm over the head, right nipple is everted. No periareolar edema or nipple discharge. No mass in any quadrant or subareolar region. No redness of the skin. No right axillary adenopathy. With the left arm over the head, left nipple is everted. No periareolar edema or nipple discharge. No mass in any quadrant or subareolar region. No redness of the skin. No left axillary adenopathy.    ASSESSMENT: 26 year old female with significant  family history of cancers including in her mother as well as her grandmother. Both of them had breast cancer. There is no family history known for ovarian cancer. Patient has been tested for the BRCA1 and 2 mutations she was found to be BRCA1 mutation carrier. She is seen today for discussion of breast cancer and ovarian cancer risk reduction. At this point she is not interested in having bilateral mastectomies due to her young age and she would like to preserve her breasts for as long as you possibly can which I concur with. She also is trying to start a family and she is very much interested in preserving her ovaries. Although patient does suffer from infertility as does her husband she is hoping that she will be able to conceive naturally. She at this point is not interested in going to alternative fertility treatments. She and her husband are both on the same page.  PLAN:  #1 patient and I and her husband had an extensive discussion greater than 60 minutes Re: her results a BRCA1 mutation. We discussed her risk of developing breast cancer her lifetime risk is greater than 85% for breast  and greater than 44 ovarian based on her mutation status. She is very much interested in preserving her fertility and she will continue to try to get pregnant.  #2 we did discuss close surveillance including MRIs and mammograms. We also discussed vaginal ultrasounds and monitoring for CA 125 4 ovarian risk. She is certainly eligible for having MRIs performed due to her mutation status. I will go ahead and get this set up. She can have her vaginal ultrasounds and oncologic examinations by her gynecologist.  #3 we did discuss chemoprevention with tamoxifen however I do think since patient is trying to actively conceive this would not be in her best interest at this time. However at some later time if she decides to go on tamoxifen as a preventive then we certainly could give her a prescription. However in the setting of her  actively trying to conceive a child tamoxifen would be contraindicated.  #4 I have set the patient up for MRIs of the breasts and she will also have mammograms performed. No plan on seeing her back in a few months time.  The length of time of the face-to-face encounter was 60  minutes. More than 50% of time was spent counseling and coordination of care.  Drue Second, MD Medical/Oncology [facility] [phone] (beeper) [phone] (Office)

## 2012-10-03 ENCOUNTER — Telehealth: Payer: Self-pay | Admitting: Oncology

## 2012-10-03 ENCOUNTER — Telehealth: Payer: Self-pay | Admitting: *Deleted

## 2012-10-03 DIAGNOSIS — Z1509 Genetic susceptibility to other malignant neoplasm: Secondary | ICD-10-CM

## 2012-10-03 NOTE — Telephone Encounter (Signed)
Pt called and said that she was tested positive for BRCA 1 testing, she said that the oncologist thought it would be an good idea for her to said getting mammograms. Pt asked me to relay this message to you.

## 2012-10-03 NOTE — Telephone Encounter (Signed)
Telephone call to review request. States has seen Dr. Welton Flakes at the cancer center and has a scheduled breast MRI  12/09/2012. Planning to see Dr. Welton Flakes annually. Was recommended to have a mammogram within 3 months of the MRI. Best time to go are any Friday afternoon after 2. She is 89, mother, maternal grandmother both with breast cancer and is BRCA 1 positive. Also recommended to have any breast exam opposite time of mammogram. Annual exam  April or May. Desiring pregnancy but is not going to proceed with infertility management due to increased risks.   Victorino Dike, please schedule screening mammogram, ask if 3-D can be done since only 26. Patient works in a [employer], Friday afternoon after 2, she is off work.

## 2012-10-03 NOTE — Telephone Encounter (Signed)
Message left

## 2012-10-03 NOTE — Telephone Encounter (Signed)
lmonvm adviisng the pt of her jan 2014 appts °

## 2012-10-03 NOTE — Telephone Encounter (Signed)
lmonvm of Leah Davis regarding this pt needing an mri breast along with a mammogram since she is brbrca positive. Waiting to hear back from [facility]

## 2012-10-04 ENCOUNTER — Encounter: Payer: BC Managed Care – PPO | Admitting: Oncology

## 2012-10-04 NOTE — Telephone Encounter (Signed)
Appointment 12/07/12 @ 2:30pm

## 2012-10-04 NOTE — Telephone Encounter (Signed)
Order placed

## 2012-11-11 ENCOUNTER — Ambulatory Visit: Payer: BC Managed Care – PPO

## 2012-11-30 DIAGNOSIS — Z1501 Genetic susceptibility to malignant neoplasm of breast: Secondary | ICD-10-CM

## 2012-11-30 HISTORY — DX: Genetic susceptibility to other malignant neoplasm: Z15.01

## 2012-12-07 ENCOUNTER — Ambulatory Visit
Admission: RE | Admit: 2012-12-07 | Discharge: 2012-12-07 | Disposition: A | Discharge status: Home / Self Care | Payer: BC Managed Care – PPO | Source: Ambulatory Visit | Attending: Oncology | Admitting: Oncology

## 2012-12-07 ENCOUNTER — Ambulatory Visit: Payer: BC Managed Care – PPO

## 2012-12-07 DIAGNOSIS — Z1231 Encounter for screening mammogram for malignant neoplasm of breast: Secondary | ICD-10-CM

## 2012-12-09 ENCOUNTER — Ambulatory Visit: Payer: BC Managed Care – PPO | Admitting: Adult Health

## 2012-12-09 ENCOUNTER — Other Ambulatory Visit: Payer: BC Managed Care – PPO | Admitting: Lab

## 2012-12-12 NOTE — Progress Notes (Signed)
Had normal 3-D mammogram 12/07/12 and has followup scheduled.

## 2012-12-15 ENCOUNTER — Other Ambulatory Visit: Payer: Self-pay | Admitting: Medical Oncology

## 2012-12-15 DIAGNOSIS — N469 Male infertility, unspecified: Secondary | ICD-10-CM

## 2012-12-15 DIAGNOSIS — Z1501 Genetic susceptibility to malignant neoplasm of breast: Secondary | ICD-10-CM

## 2012-12-15 DIAGNOSIS — N943 Premenstrual tension syndrome: Secondary | ICD-10-CM

## 2012-12-16 ENCOUNTER — Ambulatory Visit (HOSPITAL_BASED_OUTPATIENT_CLINIC_OR_DEPARTMENT_OTHER): Payer: BC Managed Care – PPO | Admitting: Adult Health

## 2012-12-16 ENCOUNTER — Telehealth: Payer: Self-pay | Admitting: *Deleted

## 2012-12-16 ENCOUNTER — Encounter: Payer: Self-pay | Admitting: Adult Health

## 2012-12-16 ENCOUNTER — Other Ambulatory Visit (HOSPITAL_BASED_OUTPATIENT_CLINIC_OR_DEPARTMENT_OTHER): Payer: BC Managed Care – PPO | Admitting: Lab

## 2012-12-16 VITALS — BP 162/82 | HR 89 | Temp 98.5°F | Resp 18 | Ht 66.0 in | Wt 156.7 lb

## 2012-12-16 DIAGNOSIS — N469 Male infertility, unspecified: Secondary | ICD-10-CM

## 2012-12-16 DIAGNOSIS — N943 Premenstrual tension syndrome: Secondary | ICD-10-CM

## 2012-12-16 DIAGNOSIS — Z1501 Genetic susceptibility to malignant neoplasm of breast: Secondary | ICD-10-CM

## 2012-12-16 DIAGNOSIS — Z1509 Genetic susceptibility to other malignant neoplasm: Secondary | ICD-10-CM

## 2012-12-16 DIAGNOSIS — Z803 Family history of malignant neoplasm of breast: Secondary | ICD-10-CM

## 2012-12-16 LAB — CBC WITH DIFFERENTIAL/PLATELET
BASO%: 0.3 % (ref 0.0–2.0)
HCT: 41.6 % (ref 34.8–46.6)
MCHC: 33.7 g/dL (ref 31.5–36.0)
MONO#: 0.6 10*3/uL (ref 0.1–0.9)
NEUT#: 6.8 10*3/uL — ABNORMAL HIGH (ref 1.5–6.5)
RBC: 4.64 10*6/uL (ref 3.70–5.45)
WBC: 10.8 10*3/uL — ABNORMAL HIGH (ref 3.9–10.3)
lymph#: 3.3 10*3/uL (ref 0.9–3.3)

## 2012-12-16 LAB — COMPREHENSIVE METABOLIC PANEL (CC13)
ALT: 16 U/L (ref 0–55)
BUN: 12 mg/dL (ref 7.0–26.0)
Chloride: 107 mEq/L (ref 98–107)
Glucose: 121 mg/dl — ABNORMAL HIGH (ref 70–99)
Total Bilirubin: 0.73 mg/dL (ref 0.20–1.20)
Total Protein: 7.6 g/dL (ref 6.4–8.3)

## 2012-12-16 NOTE — Progress Notes (Signed)
[facility]  High Risk Clinic New Patient Evaluation  Name: Leah Davis            Date: 12/16/2012 MRN: [id number]                DOB: 02/24/1986  CC:  Dr. Lezlie Lye, NP  REFERRING PHYSICIAN: Maryelizabeth Rowan NP [facility] gynecology  REASON FOR VISIT: 27 year old female who is seen for discussion regarding BRCA1 mutation results and risk reduction for breast and ovarian cancers.  HISTORY OF PRESENT ILLNESS: Leah Davis is a 27 y.o. female here for discussion of hereditary cancers including breast and ovarian. She has a family history for grandmother diagnosed with breast cancer at the age of 86-44 her mother was diagnosed with breast cancer at 93. Her mother was found to be BRCA1 mutation carrier. Patient therefore was tested as well and she was found to be positive for BRCA1 mutation. She was recently seen in our genetics clinic for discussion of genetic predisposition to cancer and to clarify her risks as well as her family's risk for cancers. Patient also is premenopausal and has a history of infertility.  She and him are continuing to try to get pregnant, therefore she isn't interested in Tamoxifen at this time.  She is otherwise well.    PAST MEDICAL HISTORY:  has a past medical history of Migraine; PMS (premenstrual syndrome); and Condyloma acuminata (05/2012).  PAST SURGICAL HISTORY:  Past Surgical History  Procedure Date  . Wisdom tooth extraction       CURRENT MEDICATIONS: Leah Davis does not currently have medications on file.  ALLERGIES: Review of patient's allergies indicates no known allergies.  SOCIAL HISTORY:  reports that she has never smoked. She has never used smokeless tobacco. She reports that she does not drink alcohol or use illicit drugs.  HEALTH HABITS: Vitamins:yes Supplements:none Alternative Therapies:none Adverse environmental exposure: none Servings of fruit and vegetables/day: 2-3 Servings  of meat/day:1 (chicken) Exercises regularly:   1/week               Smoker/nonsmoker: none Alcohol: 1/day Number of alcoholic beverages/week: 7/week  REPRODUCTIVE HISTORY:  Menarche age: 73 Gravida: 0   Para: 0 First Live Birth:N/A Number of live births: N/A Breast fed: Y/N  # months N/A Took fertility meds:  none                   Menses: regular Oral Contraceptives:      none   # of years Menopause: natural/surgical  Age N/A HRT Y/N Currently Y/N TType:         N/A                    Sexually transmitted disease:    FAMILY HISTORY:  family history includes Breast cancer in her maternal grandmother and Breast cancer (age of onset:42) in her mother.  HEALTH MAINTENANCE: Last mammogram: none Last clinical breast exam: 4/13 Performs self breast exam: none Last Pap Smear: 2012 Colonoscopy: none Last skin exam:  REVIEW OF SYSTEMS:  General: Negative for fever, chills, night sweats,  loss of appetite or weight loss. HEENT: Negative for headaches, sore  throat, difficulty swallowing, blurred vision or problem with hearing or  sinus congestion. Respiratory: Negative for shortness of breath, cough  or dyspnea on exertion. Cardiovascular: Negative for chest pain,  palpitations or pedal edema. GI: Negative for nausea, vomiting,  diarrhea, constipation, change in bowel habits or blood in  the stool.  No jaundice. GU: Negative for painful or frequent urination, change in  color of urine, or decreased urinary stream. Integumentary: Negative  for skin rashes or other suspicious skin lesions. Hematologic: Negative  for easy bruisability or bleeding. Musculoskeletal: Negative for  complaints of pain, arthralgias, arthritis or myalgias.  Neurological/psychiatric: Negative for numbness, focal weakness,  balance problems or coordination difficulties. No depression or mood swings.  Breast: No self detected abnormalities in the breast. No nipple discharge, masses or redness of the skin.    PHYSICAL EXAM: BP 162/82  Pulse 89  Temp 98.5 F (36.9 C) (Oral)  Resp 18  Ht 5\' 6"  (1.676 m)  Wt 156 lb 11.2 oz (71.079 kg)  BMI 25.29 kg/m2 GENERAL: Well developed, well nourished, in no acute distress.  EENT: No ocular or oral lesions. No stomatitis.  RESPIRATORY: Lungs are clear to auscultation bilaterally with normal respiratory movement and no accessory muscle use. CARDIAC: No murmur, rub or tachycardia. No upper or lower extremity edema.  GI: Abdomen is soft, no palpable hepatosplenomegaly. No fluid wave. No tenderness. Musculoskeletal: No kyphosis, no tenderness over the spine, ribs or hips. Lymph: No cervical, infraclavicular, axillary or inguinal adenopathy. Neuro: No focal neurological deficits. Psych: Alert and oriented X 3, appropriate mood and affect.  BREAST EXAM: In the supine position, with the right arm over the head, right nipple is everted. No periareolar edema or nipple discharge. No mass in any quadrant or subareolar region. No redness of the skin. No right axillary adenopathy. With the left arm over the head, left nipple is everted. No periareolar edema or nipple discharge. No mass in any quadrant or subareolar region. No redness of the skin. No left axillary adenopathy.    ASSESSMENT: 27 year old female with significant family history of cancers including in her mother as well as her grandmother. Both of them had breast cancer. There is no family history known for ovarian cancer. Patient has been tested for the BRCA1 and 2 mutations she was found to be BRCA1 mutation carrier. She is seen today for discussion of breast cancer and ovarian cancer risk reduction. At this point she is not interested in having bilateral mastectomies due to her young age and she would like to preserve her breasts for as long as you possibly can which I concur with. She also is trying to start a family and she is very much interested in preserving her ovaries. Although patient does suffer  from infertility as does her husband she is hoping that she will be able to conceive naturally. She at this point is not interested in going to alternative fertility treatments. She and her husband are both on the same page.  PLAN:  #1 Malisha and I discussed her neg. mammo results and she will schedule her MRI this month.  Her breast exam is normal, no sign of cancer.  I counseled her on self breast exams, healthy eating and regular exercise.  We will see her back in one year for follow up.    The length of time of the face-to-face encounter was 30  minutes. More than 50% of time was spent counseling and coordination of care.  Cherie Ouch Lyn Hollingshead, NP Medical Oncology [facility] Phone: [phone]

## 2012-12-16 NOTE — Patient Instructions (Addendum)
Doing well.  No sign of cancer.  We will follow up on your test results and see you in one year.

## 2012-12-16 NOTE — Telephone Encounter (Signed)
Gave patient appointment for one year

## 2012-12-25 ENCOUNTER — Other Ambulatory Visit: Payer: BC Managed Care – PPO

## 2012-12-26 ENCOUNTER — Telehealth: Payer: Self-pay | Admitting: *Deleted

## 2012-12-26 NOTE — Telephone Encounter (Signed)
Please call her insurance company to see if we can get an override/coverage for MRI patient is BRCA 1 positive.

## 2012-12-26 NOTE — Telephone Encounter (Signed)
Pt has MRI breast scheduled on 12/28/12, pt said that her insurance will not pay for this test. She asked if you thought she really needed to have this? She would like to speak with you about this,pt is positive for BRCA1 testing. Call back # [phone] she take lunch from 1-2 and off work at [employer].

## 2012-12-28 ENCOUNTER — Other Ambulatory Visit: Payer: BC Managed Care – PPO

## 2012-12-28 NOTE — Telephone Encounter (Signed)
Pt will contact cancer for additional information regarding MRI.

## 2012-12-30 NOTE — Telephone Encounter (Signed)
Message left on cell to call

## 2012-12-30 NOTE — Telephone Encounter (Signed)
Telephone call, is in the process was switching insurance. Will then try to get MRI scheduled after.

## 2013-01-18 ENCOUNTER — Telehealth: Payer: Self-pay | Admitting: Medical Oncology

## 2013-01-18 NOTE — Telephone Encounter (Signed)
PT LVMOM inquiring whether she could wait a year for the MRI since her mammogram "looked good." Patient stated she hasn't gotten the MRI yet d/t insurance changes and just wanted to confirm whether she could wait. Reviewed pts question with Dr. Welton Flakes and per Dr Welton Flakes spoke with patient that it is important for her to have MRI since she has not had one and it would be important to have a baseline MRI. Patient expressed verbal understanding and gratitude to Dr Welton Flakes. Patient with no further questions. Stated she will follow through with having an MRI and knows to call with any questions or concerns.

## 2013-02-22 ENCOUNTER — Ambulatory Visit
Admission: RE | Admit: 2013-02-22 | Discharge: 2013-02-22 | Disposition: A | Discharge status: Home / Self Care | Payer: BC Managed Care – PPO | Source: Ambulatory Visit | Attending: Oncology | Admitting: Oncology

## 2013-02-22 DIAGNOSIS — Z1501 Genetic susceptibility to malignant neoplasm of breast: Secondary | ICD-10-CM

## 2013-02-22 MED ORDER — GADOBENATE DIMEGLUMINE 529 MG/ML IV SOLN
14.0000 mL | Freq: Once | INTRAVENOUS | Status: AC | PRN
  Administered 2013-02-22: 14 mL via INTRAVENOUS

## 2013-02-24 ENCOUNTER — Telehealth: Payer: Self-pay | Admitting: Medical Oncology

## 2013-02-24 NOTE — Telephone Encounter (Signed)
Patient called inquiring results to MRI from 02/22/13, informed patient, per Augustin Schooling, NP, no evidence of malignancy in either breast and pt to f/u in a year. Patient verbalized understanding. No further questions at this time, knows to call office with any questions or concerns. Patient with sched appt 12/20/2013 lab/MD.

## 2013-05-26 ENCOUNTER — Other Ambulatory Visit (HOSPITAL_COMMUNITY)
Admission: RE | Admit: 2013-05-26 | Discharge: 2013-05-26 | Disposition: A | Discharge status: Home / Self Care | Payer: BC Managed Care – PPO | Source: Ambulatory Visit | Attending: Gynecology | Admitting: Gynecology

## 2013-05-26 ENCOUNTER — Ambulatory Visit (INDEPENDENT_AMBULATORY_CARE_PROVIDER_SITE_OTHER): Payer: BC Managed Care – PPO | Admitting: Women's Health

## 2013-05-26 ENCOUNTER — Encounter: Payer: Self-pay | Admitting: Women's Health

## 2013-05-26 VITALS — BP 124/72 | Ht 67.0 in | Wt 151.0 lb

## 2013-05-26 DIAGNOSIS — F411 Generalized anxiety disorder: Secondary | ICD-10-CM

## 2013-05-26 DIAGNOSIS — Z01419 Encounter for gynecological examination (general) (routine) without abnormal findings: Secondary | ICD-10-CM | POA: Insufficient documentation

## 2013-05-26 MED ORDER — ALPRAZOLAM 0.25 MG PO TABS: 0.2500 mg | ORAL_TABLET | Freq: Every evening | ORAL | Status: DC | PRN

## 2013-05-26 NOTE — Progress Notes (Signed)
Leah Davis 12/23/1985 [id number]    History:    The patient presents for annual exam.  Monthly cycle, desires pregnancy, infertility, husband low sperm count and anovulation. Mother diagnosed with breast cancer at age 27, MGM at 38. BRCA1 positive, has had counseling with Dr. Dorena Dew a negative breast MRI 01/2013 and normal mammogram 3D tomography 02/2013. Will continue annual MRI and mammograms. Planning bilateral oophorectomy at menopause. Gardasil series completed in 07. Normal Pap history.  Past medical history, past surgical history, family history and social history were all reviewed and documented in the EPIC chart. Dental assistant. Several family issues, father was in alcohol rehab.    ROS:  A  ROS was performed and pertinent positives and negatives are included in the history.  Exam:  Filed Vitals:   05/26/13 0923  BP: 124/72    General appearance:  Normal Head/Neck:  Normal, without cervical or supraclavicular adenopathy. Thyroid:  Symmetrical, normal in size, without palpable masses or nodularity. Respiratory  Effort:  Normal  Auscultation:  Clear without wheezing or rhonchi Cardiovascular  Auscultation:  Regular rate, without rubs, murmurs or gallops  Edema/varicosities:  Not grossly evident Abdominal  Soft,nontender, without masses, guarding or rebound.  Liver/spleen:  No organomegaly noted  Hernia:  None appreciated  Skin  Inspection:  Grossly normal  Palpation:  Grossly normal Neurologic/psychiatric  Orientation:  Normal with appropriate conversation.  Mood/affect:  Normal  Genitourinary    Breasts: Examined lying and sitting.     Right: Without masses, retractions, discharge or axillary adenopathy.     Left: Without masses, retractions, discharge or axillary adenopathy.   Inguinal/mons:  Normal without inguinal adenopathy  External genitalia:  Normal  BUS/Urethra/Skene's glands:  Normal  Bladder:  Normal  Vagina:  Normal  Cervix:  Normal  Uterus:   normal in size, shape and contour.  Midline and mobile  Adnexa/parametria:     Rt: Without masses or tenderness.   Lt: Without masses or tenderness.  Anus and perineum: Normal  Digital rectal exam: Normal sphincter tone without palpated masses or tenderness  Assessment/Plan:  27 y.o. M. WF G0 for annual exam.     Infertility Positive BRCA 1 status Dr Welton Flakes  Plan: Prenatal vitamin daily, continue frequent intercourse, return to office with missed cycle for viability ultrasound. Does not want to pursue further infertility at this time but does plan possible treatment later. Will continue annual OV's with Dr.Khan with annual mammograms and breast MRIs. SBE's, regular exercise, calcium rich diet encouraged. No labs today.  Harrington Challenger WHNP, 1:17 PM 05/26/2013

## 2013-05-26 NOTE — Patient Instructions (Addendum)

## 2013-06-07 ENCOUNTER — Telehealth: Payer: Self-pay | Admitting: *Deleted

## 2013-06-07 DIAGNOSIS — IMO0002 Reserved for concepts with insufficient information to code with codable children: Secondary | ICD-10-CM

## 2013-06-07 NOTE — Telephone Encounter (Signed)
Pt said that before Dr.Yakinkaya left [facility] her asked her to have her hormones recheck. He would like her FSH, estradiol level, AMH level checked. These labs are checked on day 3 of cycle, cycle started yesterday but very light as usual, still light today but should get heavier today. Pt asked if she should count yesterday the 1st day even if  very light? And also if she could come here and have labs drawn? Please advise

## 2013-06-07 NOTE — Telephone Encounter (Signed)
Left the below on pt voicemail, orders placed. I told pt to call and make lab appointment.

## 2013-06-07 NOTE — Telephone Encounter (Signed)
Please call and inform Yes, count the first day of bleeding even though it was light as day one and she can come here to have those labs drawn. Please put in the computer.

## 2013-06-08 ENCOUNTER — Ambulatory Visit: Payer: BC Managed Care – PPO

## 2013-06-08 DIAGNOSIS — IMO0002 Reserved for concepts with insufficient information to code with codable children: Secondary | ICD-10-CM

## 2013-06-10 LAB — ESTRADIOL: Estradiol: 34.5 pg/mL

## 2013-06-13 LAB — ANTI MULLERIAN HORMONE: AMH AssessR: 1.22 ng/mL

## 2013-06-15 ENCOUNTER — Telehealth: Payer: Self-pay

## 2013-06-15 NOTE — Telephone Encounter (Signed)
I called patient earlier with test results and let her know that you said her estradiol was low.  She called back with questions wanting to know how these levels compared to last ones and also, is estradiol being low a bad thing?   She has not yet scheduled follow-up at [facility] but said she needs to call and do that. She said she will be seeing Dr. Susy Manor when she does. They had wanted her to get these labs done first.

## 2013-06-15 NOTE — Telephone Encounter (Signed)
Telephone call to review labs. Did review estradiol levels at 34 did review at the low end of normal. Will schedule appointment with Dr. Laural Benes.

## 2013-10-05 ENCOUNTER — Other Ambulatory Visit: Payer: Self-pay

## 2013-10-20 ENCOUNTER — Telehealth: Payer: Self-pay | Admitting: *Deleted

## 2013-10-20 NOTE — Telephone Encounter (Signed)
Lm informing the pt that KK will be in [facility] on 1.14.15. gv appt for 1/28/15w/ labs @ 2:30pm and ov@ 3pm. Made pt aware that i will mail a letter/avs...td

## 2013-11-27 ENCOUNTER — Telehealth: Payer: Self-pay | Admitting: Oncology

## 2013-11-27 NOTE — Telephone Encounter (Signed)
lvm for pt regarding to appt moved to Feb..Marland Kitchenper pt request

## 2013-11-28 ENCOUNTER — Telehealth: Payer: Self-pay | Admitting: *Deleted

## 2013-11-28 ENCOUNTER — Telehealth: Payer: Self-pay | Admitting: Oncology

## 2013-11-28 NOTE — Telephone Encounter (Signed)
Pt called to let you know she is 6 1/[redacted] weeks pregnant with the help of Dr.Johnston at [facility]. Pt said that Dr.Johnston will be releasing pt from her care in about 2 weeks. Pt would like you recommendation for OB/GYN MD. Please advise

## 2013-11-28 NOTE — Telephone Encounter (Signed)
, °

## 2013-11-29 NOTE — Telephone Encounter (Signed)
Please call and congratulate for me,  Happy new year!!! (She had been trying to concieve).  [facility] my preference or [facility],

## 2013-11-29 NOTE — Telephone Encounter (Signed)
Left the below on pt voicemail. 

## 2013-11-30 NOTE — L&D Delivery Note (Signed)
Delivery Note At 6:01 AM a viable and healthy female was delivered via Vaginal, Spontaneous Delivery (Presentation: ROA ).  APGAR: 8, 9; weight pending.   Placenta status: spontaneous, intact.  Cord:  with the following complications: tight [place] cut on perineum.  Cord pH: na  Anesthesia:  epidural Episiotomy: none Lacerations: second Suture Repair: 2.0 vicryl rapide Est. Blood Loss (mL): 200  Mom to postpartum.  Baby to Couplet care / Skin to Skin.  Daishia Fetterly J 06/23/2014, 6:14 AM

## 2013-12-20 ENCOUNTER — Ambulatory Visit: Payer: BC Managed Care – PPO | Admitting: Oncology

## 2013-12-20 ENCOUNTER — Other Ambulatory Visit: Payer: BC Managed Care – PPO | Admitting: Lab

## 2013-12-21 ENCOUNTER — Telehealth: Payer: Self-pay | Admitting: Oncology

## 2013-12-21 NOTE — Telephone Encounter (Signed)
returned pt call adn lvm advised to call back with d/t that she needed to r/s appt to.

## 2013-12-22 ENCOUNTER — Telehealth: Payer: Self-pay | Admitting: *Deleted

## 2013-12-22 ENCOUNTER — Telehealth: Payer: Self-pay | Admitting: Oncology

## 2013-12-22 NOTE — Telephone Encounter (Signed)
Pt called requesting date of last pap, it was 04/2013. Left this on pt voicemail.

## 2013-12-22 NOTE — Telephone Encounter (Signed)
, °

## 2013-12-27 ENCOUNTER — Other Ambulatory Visit: Payer: BC Managed Care – PPO

## 2013-12-27 ENCOUNTER — Ambulatory Visit: Payer: Self-pay | Admitting: Oncology

## 2014-01-10 ENCOUNTER — Ambulatory Visit: Payer: Self-pay | Admitting: Oncology

## 2014-01-10 ENCOUNTER — Other Ambulatory Visit: Payer: BC Managed Care – PPO

## 2014-01-11 ENCOUNTER — Ambulatory Visit: Payer: Self-pay | Admitting: Oncology

## 2014-01-11 ENCOUNTER — Other Ambulatory Visit: Payer: BC Managed Care – PPO

## 2014-02-22 ENCOUNTER — Other Ambulatory Visit: Payer: Self-pay

## 2014-02-22 ENCOUNTER — Telehealth: Payer: Self-pay | Admitting: Oncology

## 2014-02-22 DIAGNOSIS — Z1509 Genetic susceptibility to other malignant neoplasm: Principal | ICD-10-CM

## 2014-02-22 DIAGNOSIS — Z1501 Genetic susceptibility to malignant neoplasm of breast: Secondary | ICD-10-CM

## 2014-02-22 NOTE — Telephone Encounter (Signed)
returned pt call re moving 3/27 lb/KK to AM. lmonvm informing pt that AM is not avail and suggested that pt try ot keep her appt as KK has no availability for the next few monthls.

## 2014-02-23 ENCOUNTER — Ambulatory Visit (HOSPITAL_BASED_OUTPATIENT_CLINIC_OR_DEPARTMENT_OTHER): Payer: BC Managed Care – PPO | Admitting: Oncology

## 2014-02-23 ENCOUNTER — Other Ambulatory Visit (HOSPITAL_BASED_OUTPATIENT_CLINIC_OR_DEPARTMENT_OTHER): Payer: BC Managed Care – PPO

## 2014-02-23 ENCOUNTER — Encounter: Payer: Self-pay | Admitting: Oncology

## 2014-02-23 VITALS — BP 148/78 | HR 84 | Temp 98.3°F | Resp 18 | Ht 67.0 in | Wt 166.5 lb

## 2014-02-23 DIAGNOSIS — Z331 Pregnant state, incidental: Secondary | ICD-10-CM

## 2014-02-23 DIAGNOSIS — Z1501 Genetic susceptibility to malignant neoplasm of breast: Secondary | ICD-10-CM

## 2014-02-23 DIAGNOSIS — Z1509 Genetic susceptibility to other malignant neoplasm: Principal | ICD-10-CM

## 2014-02-23 DIAGNOSIS — Z3490 Encounter for supervision of normal pregnancy, unspecified, unspecified trimester: Secondary | ICD-10-CM

## 2014-02-23 DIAGNOSIS — Z803 Family history of malignant neoplasm of breast: Secondary | ICD-10-CM

## 2014-02-23 LAB — CBC WITH DIFFERENTIAL/PLATELET
BASO%: 0.4 % (ref 0.0–2.0)
Basophils Absolute: 0 10*3/uL (ref 0.0–0.1)
EOS ABS: 0.1 10*3/uL (ref 0.0–0.5)
EOS%: 0.7 % (ref 0.0–7.0)
HCT: 36.6 % (ref 34.8–46.6)
HGB: 12.4 g/dL (ref 11.6–15.9)
LYMPH#: 2.3 10*3/uL (ref 0.9–3.3)
LYMPH%: 19.2 % (ref 14.0–49.7)
MCH: 30.4 pg (ref 25.1–34.0)
MCHC: 33.9 g/dL (ref 31.5–36.0)
MCV: 89.7 fL (ref 79.5–101.0)
MONO#: 0.6 10*3/uL (ref 0.1–0.9)
MONO%: 4.9 % (ref 0.0–14.0)
NEUT%: 74.8 % (ref 38.4–76.8)
NEUTROS ABS: 8.8 10*3/uL — AB (ref 1.5–6.5)
PLATELETS: 185 10*3/uL (ref 145–400)
RBC: 4.08 10*6/uL (ref 3.70–5.45)
RDW: 13.1 % (ref 11.2–14.5)
WBC: 11.7 10*3/uL — AB (ref 3.9–10.3)

## 2014-02-23 LAB — COMPREHENSIVE METABOLIC PANEL (CC13)
ALBUMIN: 3.5 g/dL (ref 3.5–5.0)
ALT: 12 U/L (ref 0–55)
ANION GAP: 10 meq/L (ref 3–11)
AST: 14 U/L (ref 5–34)
Alkaline Phosphatase: 43 U/L (ref 40–150)
BUN: 9.3 mg/dL (ref 7.0–26.0)
CALCIUM: 9.1 mg/dL (ref 8.4–10.4)
CHLORIDE: 108 meq/L (ref 98–109)
CO2: 20 meq/L — AB (ref 22–29)
Creatinine: 0.7 mg/dL (ref 0.6–1.1)
GLUCOSE: 95 mg/dL (ref 70–140)
POTASSIUM: 3.7 meq/L (ref 3.5–5.1)
SODIUM: 138 meq/L (ref 136–145)
TOTAL PROTEIN: 6.7 g/dL (ref 6.4–8.3)
Total Bilirubin: 0.21 mg/dL (ref 0.20–1.20)

## 2014-02-24 ENCOUNTER — Encounter: Payer: Self-pay | Admitting: Oncology

## 2014-02-24 DIAGNOSIS — Z3490 Encounter for supervision of normal pregnancy, unspecified, unspecified trimester: Secondary | ICD-10-CM | POA: Insufficient documentation

## 2014-02-24 HISTORY — DX: Encounter for supervision of normal pregnancy, unspecified, unspecified trimester: Z34.90

## 2014-02-24 NOTE — Progress Notes (Signed)
[facility] OFFICE PROGRESS NOTE  Patient Care Team: Baruch Goldmann as PCP - General (Physician Assistant) Elon Alas, NP  Dr. Brien Few   DIAGNOSIS: 28 year old female who is seen for discussion regarding BRCA1 mutation results and risk reduction for breast and ovarian cancers With 12 week pregnancy  SUMMARY OF ONCOLOGIC HISTORY: #1She has a family history for grandmother diagnosed with breast cancer at the age of 33-44 her mother was diagnosed with breast cancer at 54. Her mother was found to be BRCA1 mutation carrier. Patient therefore was tested as well and she was found to be positive for BRCA1 mutation  CURRENT THERAPY:  Observation  INTERVAL HISTORY: Leah Davis 28 y.o. female returns for followup visit today for BRCA1 mutation. Clinically patient seems to be doing well without any problems. She has not been used up in [redacted] weeks pregnant. She is expecting a baby boy. If both are thrilled. They did have to do ICSI therapy. This was done at [facility]. She is having a good pregnancy without any complications. Remainder of the 10 point review of systems is negative and as below  I have reviewed the past medical history, past surgical history, social history and family history with the patient and they are unchanged from previous note.  ALLERGIES:  has No Known Allergies.  MEDICATIONS:  Current Outpatient Prescriptions  Medication Sig Dispense Refill  . butalbital-acetaminophen-caffeine (FIORICET, ESGIC) 50-325-40 MG per tablet       . Prenatal Vit-Fe Fumarate-FA (PRENATAL MULTIVITAMIN) TABS tablet Take 1 tablet by mouth daily at 12 noon.      Marland Kitchen ALPRAZolam (XANAX) 0.25 MG tablet Take 1 tablet (0.25 mg total) by mouth at bedtime as needed.  30 tablet  1  . Multiple Vitamin (MULTIVITAMIN) capsule Take 1 capsule by mouth daily.        . SUMAtriptan (IMITREX) 100 MG tablet Take 100 mg by mouth every 2 (two) hours as needed.         No current  facility-administered medications for this visit.    REVIEW OF SYSTEMS:   Constitutional: Denies fevers, chills or abnormal weight loss Eyes: Denies blurriness of vision Ears, nose, mouth, throat, and face: Denies mucositis or sore throat Respiratory: Denies cough, dyspnea or wheezes Cardiovascular: Denies palpitation, chest discomfort or lower extremity swelling Gastrointestinal:  Denies nausea, heartburn or change in bowel habits Skin: Denies abnormal skin rashes Lymphatics: Denies new lymphadenopathy or easy bruising Neurological:Denies numbness, tingling or new weaknesses Behavioral/Psych: Mood is stable, no new changes  All other systems were reviewed with the patient and are negative.  PHYSICAL EXAMINATION: ECOG PERFORMANCE STATUS: 0 - Asymptomatic  Filed Vitals:   02/23/14 1519  BP: 148/78  Pulse: 84  Temp: 98.3 F (36.8 C)  Resp: 18   Filed Weights   02/23/14 1519  Weight: 166 lb 8 oz (75.524 kg)    GENERAL:alert, no distress and comfortable Remainder of the Exam not performed  LABORATORY DATA:  I have reviewed the data as listed    Component Value Date/Time   NA 138 02/23/2014 1514   K 3.7 02/23/2014 1514   CL 107 12/16/2012 1449   CO2 20* 02/23/2014 1514   GLUCOSE 95 02/23/2014 1514   GLUCOSE 121* 12/16/2012 1449   BUN 9.3 02/23/2014 1514   CREATININE 0.7 02/23/2014 1514   CALCIUM 9.1 02/23/2014 1514   PROT 6.7 02/23/2014 1514   ALBUMIN 3.5 02/23/2014 1514   AST 14 02/23/2014 1514   ALT 12 02/23/2014 1514  ALKPHOS 43 02/23/2014 1514   BILITOT 0.21 02/23/2014 1514    No results found for this basename: SPEP, UPEP,  kappa and lambda light chains    Lab Results  Component Value Date   WBC 11.7* 02/23/2014   NEUTROABS 8.8* 02/23/2014   HGB 12.4 02/23/2014   HCT 36.6 02/23/2014   MCV 89.7 02/23/2014   PLT 185 02/23/2014      Chemistry      Component Value Date/Time   NA 138 02/23/2014 1514   K 3.7 02/23/2014 1514   CL 107 12/16/2012 1449   CO2 20* 02/23/2014  1514   BUN 9.3 02/23/2014 1514   CREATININE 0.7 02/23/2014 1514      Component Value Date/Time   CALCIUM 9.1 02/23/2014 1514   ALKPHOS 43 02/23/2014 1514   AST 14 02/23/2014 1514   ALT 12 02/23/2014 1514   BILITOT 0.21 02/23/2014 1514       RADIOGRAPHIC STUDIES: I have personally reviewed the radiological images as listed and agreed with the findings in the report. No results found.    ASSESSMENT & PLAN:  28 year old female with family history of cancers in both mother as well as grandmother. Both have breast cancer. No known ovarian cancer. Patient was tested for BRCA1 and BRCA2 gene mutations and she was positive for BRCA one mutation. She had been just followed expectantly without any active interventions. She was interested in conceiving. She is now 3 months pregnant. Her overall health remains very good. Her last mammogram and MRI were last year. They were fine. Since she is pregnant she will not give any kind of radiologic studies. But she is encouraged to do self breast examinations on a regular basis. I will plan on seeing her back in one year. At that time we will resume mammograms as well as MRIs.  No orders of the defined types were placed in this encounter.   All questions were answered. The patient knows to call the clinic with any problems, questions or concerns. No barriers to learning was detected. I spent 10 minutes counseling the patient face to face. The total time spent in the appointment was 15 minutes and more than 50% was on counseling and review of test results     Marcy Panning, MD

## 2014-02-26 ENCOUNTER — Telehealth: Payer: Self-pay | Admitting: Oncology

## 2014-02-26 NOTE — Telephone Encounter (Signed)
lvm for pt regarding to April 2016 appt....mailed pt appt sched/avs and letter °

## 2014-06-22 ENCOUNTER — Encounter (HOSPITAL_COMMUNITY): Payer: BC Managed Care – PPO | Admitting: Anesthesiology

## 2014-06-22 ENCOUNTER — Encounter (HOSPITAL_COMMUNITY): Payer: Self-pay | Admitting: *Deleted

## 2014-06-22 ENCOUNTER — Inpatient Hospital Stay (HOSPITAL_COMMUNITY): Payer: BC Managed Care – PPO | Admitting: Anesthesiology

## 2014-06-22 ENCOUNTER — Inpatient Hospital Stay (HOSPITAL_COMMUNITY)
Admission: AD | Admit: 2014-06-22 | Discharge: 2014-06-25 | DRG: 775 | Disposition: A | Discharge status: Home / Self Care | Payer: BC Managed Care – PPO | Source: Ambulatory Visit | Attending: Obstetrics and Gynecology | Admitting: Obstetrics and Gynecology

## 2014-06-22 DIAGNOSIS — Z1501 Genetic susceptibility to malignant neoplasm of breast: Secondary | ICD-10-CM

## 2014-06-22 DIAGNOSIS — D696 Thrombocytopenia, unspecified: Secondary | ICD-10-CM | POA: Diagnosis present

## 2014-06-22 DIAGNOSIS — G43909 Migraine, unspecified, not intractable, without status migrainosus: Secondary | ICD-10-CM | POA: Diagnosis present

## 2014-06-22 DIAGNOSIS — O9912 Other diseases of the blood and blood-forming organs and certain disorders involving the immune mechanism complicating childbirth: Secondary | ICD-10-CM

## 2014-06-22 DIAGNOSIS — Z803 Family history of malignant neoplasm of breast: Secondary | ICD-10-CM

## 2014-06-22 DIAGNOSIS — D689 Coagulation defect, unspecified: Secondary | ICD-10-CM | POA: Diagnosis present

## 2014-06-22 DIAGNOSIS — O4290 Premature rupture of membranes, unspecified as to length of time between rupture and onset of labor, unspecified weeks of gestation: Secondary | ICD-10-CM | POA: Diagnosis present

## 2014-06-22 LAB — COMPREHENSIVE METABOLIC PANEL
ALT: 12 U/L (ref 0–35)
AST: 18 U/L (ref 0–37)
Albumin: 2.8 g/dL — ABNORMAL LOW (ref 3.5–5.2)
Alkaline Phosphatase: 134 U/L — ABNORMAL HIGH (ref 39–117)
Anion gap: 14 (ref 5–15)
BUN: 7 mg/dL (ref 6–23)
CALCIUM: 9.4 mg/dL (ref 8.4–10.5)
CHLORIDE: 102 meq/L (ref 96–112)
CO2: 20 mEq/L (ref 19–32)
CREATININE: 0.62 mg/dL (ref 0.50–1.10)
GFR calc non Af Amer: >90 mL/min (ref >90)
GLUCOSE: 88 mg/dL (ref 70–99)
Potassium: 4.1 mEq/L (ref 3.7–5.3)
Sodium: 136 mEq/L — ABNORMAL LOW (ref 137–147)
Total Bilirubin: 0.2 mg/dL — ABNORMAL LOW (ref 0.3–1.2)
Total Protein: 6.2 g/dL (ref 6.0–8.3)

## 2014-06-22 LAB — CBC
HCT: 38.8 % (ref 36.0–46.0)
Hemoglobin: 13.4 g/dL (ref 12.0–15.0)
MCH: 31.2 pg (ref 26.0–34.0)
MCHC: 34.5 g/dL (ref 30.0–36.0)
MCV: 90.2 fL (ref 78.0–100.0)
PLATELETS: 165 10*3/uL (ref 150–400)
RBC: 4.3 MIL/uL (ref 3.87–5.11)
RDW: 12.8 % (ref 11.5–15.5)
WBC: 11.9 10*3/uL — ABNORMAL HIGH (ref 4.0–10.5)

## 2014-06-22 LAB — OB RESULTS CONSOLE ABO/RH: RH TYPE: POSITIVE

## 2014-06-22 LAB — TYPE AND SCREEN
ABO/RH(D): A POS
ANTIBODY SCREEN: NEGATIVE

## 2014-06-22 LAB — OB RESULTS CONSOLE HEPATITIS B SURFACE ANTIGEN: Hepatitis B Surface Ag: NEGATIVE

## 2014-06-22 LAB — OB RESULTS CONSOLE HIV ANTIBODY (ROUTINE TESTING): HIV: NONREACTIVE

## 2014-06-22 LAB — URIC ACID: URIC ACID, SERUM: 6.1 mg/dL (ref 2.4–7.0)

## 2014-06-22 LAB — ABO/RH: ABO/RH(D): A POS

## 2014-06-22 LAB — OB RESULTS CONSOLE GC/CHLAMYDIA
Chlamydia: NEGATIVE
GC PROBE AMP, GENITAL: NEGATIVE

## 2014-06-22 LAB — OB RESULTS CONSOLE RPR: RPR: NONREACTIVE

## 2014-06-22 LAB — OB RESULTS CONSOLE ANTIBODY SCREEN: Antibody Screen: NEGATIVE

## 2014-06-22 LAB — OB RESULTS CONSOLE RUBELLA ANTIBODY, IGM: Rubella: IMMUNE

## 2014-06-22 MED ORDER — OXYTOCIN 40 UNITS IN LACTATED RINGERS INFUSION - SIMPLE MED: 62.5000 mL/h | INTRAVENOUS | Status: DC

## 2014-06-22 MED ORDER — LIDOCAINE HCL (PF) 1 % IJ SOLN
30.0000 mL | INTRAMUSCULAR | Status: DC | PRN
  Filled 2014-06-22: qty 30

## 2014-06-22 MED ORDER — CITRIC ACID-SODIUM CITRATE 334-500 MG/5ML PO SOLN: 30.0000 mL | ORAL | Status: DC | PRN

## 2014-06-22 MED ORDER — LACTATED RINGERS IV SOLN: 500.0000 mL | INTRAVENOUS | Status: DC | PRN

## 2014-06-22 MED ORDER — FENTANYL 2.5 MCG/ML BUPIVACAINE 1/10 % EPIDURAL INFUSION (WH - ANES)
INTRAMUSCULAR | Status: DC | PRN
  Administered 2014-06-22: 14 mL/h via EPIDURAL

## 2014-06-22 MED ORDER — EPHEDRINE 5 MG/ML INJ
10.0000 mg | INTRAVENOUS | Status: DC | PRN
  Filled 2014-06-22: qty 2

## 2014-06-22 MED ORDER — LACTATED RINGERS IV SOLN
500.0000 mL | Freq: Once | INTRAVENOUS | Status: AC
  Administered 2014-06-22: 500 mL via INTRAVENOUS

## 2014-06-22 MED ORDER — ACETAMINOPHEN 325 MG PO TABS: 650.0000 mg | ORAL_TABLET | ORAL | Status: DC | PRN

## 2014-06-22 MED ORDER — PHENYLEPHRINE 40 MCG/ML (10ML) SYRINGE FOR IV PUSH (FOR BLOOD PRESSURE SUPPORT)
80.0000 ug | PREFILLED_SYRINGE | INTRAVENOUS | Status: DC | PRN
  Filled 2014-06-22: qty 2
  Filled 2014-06-22: qty 10

## 2014-06-22 MED ORDER — FLEET ENEMA 7-19 GM/118ML RE ENEM: 1.0000 | ENEMA | RECTAL | Status: DC | PRN

## 2014-06-22 MED ORDER — DEXTROSE 5 % IV SOLN
5.0000 10*6.[IU] | Freq: Once | INTRAVENOUS | Status: AC
  Administered 2014-06-22: 5 10*6.[IU] via INTRAVENOUS
  Filled 2014-06-22: qty 5

## 2014-06-22 MED ORDER — OXYTOCIN 40 UNITS IN LACTATED RINGERS INFUSION - SIMPLE MED
1.0000 m[IU]/min | INTRAVENOUS | Status: DC
  Administered 2014-06-22: 2 m[IU]/min via INTRAVENOUS
  Filled 2014-06-22: qty 1000

## 2014-06-22 MED ORDER — ONDANSETRON HCL 4 MG/2ML IJ SOLN: 4.0000 mg | Freq: Four times a day (QID) | INTRAMUSCULAR | Status: DC | PRN

## 2014-06-22 MED ORDER — TERBUTALINE SULFATE 1 MG/ML IJ SOLN: 0.2500 mg | Freq: Once | INTRAMUSCULAR | Status: AC | PRN

## 2014-06-22 MED ORDER — PENICILLIN G POTASSIUM 5000000 UNITS IJ SOLR
2.5000 10*6.[IU] | INTRAMUSCULAR | Status: DC
  Administered 2014-06-22 – 2014-06-23 (×3): 2.5 10*6.[IU] via INTRAVENOUS
  Filled 2014-06-22 (×6): qty 2.5

## 2014-06-22 MED ORDER — FENTANYL 2.5 MCG/ML BUPIVACAINE 1/10 % EPIDURAL INFUSION (WH - ANES)
14.0000 mL/h | INTRAMUSCULAR | Status: DC | PRN
  Administered 2014-06-23: 14 mL/h via EPIDURAL
  Filled 2014-06-22 (×2): qty 125

## 2014-06-22 MED ORDER — LIDOCAINE HCL (PF) 1 % IJ SOLN
INTRAMUSCULAR | Status: DC | PRN
  Administered 2014-06-22: 3 mL
  Administered 2014-06-22 (×2): 5 mL

## 2014-06-22 MED ORDER — IBUPROFEN 600 MG PO TABS: 600.0000 mg | ORAL_TABLET | Freq: Four times a day (QID) | ORAL | Status: DC | PRN

## 2014-06-22 MED ORDER — PHENYLEPHRINE 40 MCG/ML (10ML) SYRINGE FOR IV PUSH (FOR BLOOD PRESSURE SUPPORT)
80.0000 ug | PREFILLED_SYRINGE | INTRAVENOUS | Status: DC | PRN
  Filled 2014-06-22: qty 2

## 2014-06-22 MED ORDER — DIPHENHYDRAMINE HCL 50 MG/ML IJ SOLN: 12.5000 mg | INTRAMUSCULAR | Status: DC | PRN

## 2014-06-22 MED ORDER — OXYTOCIN BOLUS FROM INFUSION: 500.0000 mL | INTRAVENOUS | Status: DC

## 2014-06-22 MED ORDER — OXYCODONE-ACETAMINOPHEN 5-325 MG PO TABS: 1.0000 | ORAL_TABLET | ORAL | Status: DC | PRN

## 2014-06-22 MED ORDER — LACTATED RINGERS IV SOLN
INTRAVENOUS | Status: DC
  Administered 2014-06-22: 17:00:00 via INTRAVENOUS

## 2014-06-22 NOTE — Progress Notes (Signed)
Leah Davis is a 28 y.o. G1P0 at 2461w6d by LMP admitted for rupture of membranes  Subjective: Uncomfortable  Objective: BP 114/83  Pulse 109  Temp(Src) 99 F (37.2 C) (Oral)  Resp 20  Ht 5\' 6"  (1.676 m)  Wt 83.915 kg (185 lb)  BMI 29.87 kg/m2      FHT:  FHR: 145 bpm, variability: moderate,  accelerations:  Present,  decelerations:  Absent UC:   regular, every 3 minutes SVE:   2/90/0 IUPC placed without difficulty  Labs: Lab Results  Component Value Date   WBC 11.9* 06/22/2014   HGB 13.4 06/22/2014   HCT 38.8 06/22/2014   MCV 90.2 06/22/2014   PLT 165 06/22/2014    Assessment / Plan: Augmentation of labor, progressing well Preterm SROM Elevated BP with nl labs  Labor: Progressing normally Preeclampsia:  no signs or symptoms of toxicity, intake and ouput balanced and labs stable Fetal Wellbeing:  Category I Pain Control:  Epidural I/D:  n/a Anticipated MOD:  NSVD  Leah Davis 06/22/2014, 9:13 PM

## 2014-06-22 NOTE — Anesthesia Preprocedure Evaluation (Signed)

## 2014-06-22 NOTE — Anesthesia Procedure Notes (Signed)
Epidural Patient location during procedure: OB  Staffing Anesthesiologist: Marty Sadlowski Performed by: anesthesiologist   Preanesthetic Checklist Completed: patient identified, site marked, surgical consent, pre-op evaluation, timeout performed, IV checked, risks and benefits discussed and monitors and equipment checked  Epidural Patient position: sitting Prep: ChloraPrep Patient monitoring: heart rate, continuous pulse ox and blood pressure Approach: right paramedian Location: L3-L4 Injection technique: LOR saline  Needle:  Needle type: Tuohy  Needle gauge: 17 G Needle length: 9 cm and 9 Needle insertion depth: 6 cm Catheter type: closed end flexible Catheter size: 20 Guage Catheter at skin depth: 10 cm Test dose: negative  Assessment Events: blood not aspirated, injection not painful, no injection resistance, negative IV test and no paresthesia  Additional Notes   Patient tolerated the insertion well without complications.   

## 2014-06-22 NOTE — H&P (Signed)
Leah Davis is a 28 y.o. female presenting for SROM at 1500.  Maternal Medical History:  Reason for admission: Rupture of membranes.   Contractions: Onset was less than 1 hour ago.   Frequency: rare.   Perceived severity is mild.    Fetal activity: Perceived fetal activity is normal.   Last perceived fetal movement was within the past hour.    Prenatal complications: no prenatal complications Prenatal Complications - Diabetes: none.    OB History   Grav Para Term Preterm Abortions TAB SAB Ect Mult Living   1              Past Medical History  Diagnosis Date  . Migraine     WITHOUT AURA  . PMS (premenstrual syndrome)   . 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  Marland Kitchen BRCA1 positive 2014  . Pregnancy 02/24/2014   Past Surgical History  Procedure Laterality Date  . Wisdom tooth extraction     Family History: family history includes Breast cancer in her maternal grandmother; Breast cancer (age of onset: 55) in her mother. Social History:  reports that she has never smoked. She has never used smokeless tobacco. She reports that she drinks alcohol. She reports that she does not use illicit drugs.   Prenatal Transfer Tool  Maternal Diabetes: No Genetic Screening: Normal Maternal Ultrasounds/Referrals: Normal Fetal Ultrasounds or other Referrals:  None Maternal Substance Abuse:  No Significant Maternal Medications:  None Significant Maternal Lab Results:  None Other Comments:  None  Review of Systems  All other systems reviewed and are negative.   Dilation: 2 Effacement (%): 70 Station: -2 Exam by:: Joylene Igo RN Blood pressure 143/89, pulse 82, temperature 99 F (37.2 C), temperature source Oral, resp. rate 20, height _0  (1.676 m), weight 83.915 kg (185 lb). Maternal Exam:  Uterine Assessment: Contraction strength is mild.  Contraction frequency is rare.   Abdomen: Patient reports no abdominal tenderness. Fetal presentation:  vertex  Introitus: Normal vulva. Normal vagina.  Ferning test: positive.  Nitrazine test: positive. Amniotic fluid character: clear.  Pelvis: adequate for delivery.   Cervix: Cervix evaluated by digital exam.     Physical Exam  Nursing note and vitals reviewed. Constitutional: She is oriented to person, place, and time. She appears well-developed and well-nourished.  HENT:  Head: Normocephalic and atraumatic.  Neck: Normal range of motion. Neck supple.  Cardiovascular: Normal rate, regular rhythm and normal heart sounds.   Respiratory: Effort normal and breath sounds normal.  GI: Soft. Bowel sounds are normal.  Genitourinary: Vagina normal and uterus normal.  Musculoskeletal: Normal range of motion.  Neurological: She is alert and oriented to person, place, and time. She has normal reflexes.    Prenatal labs: ABO, Rh: A/Positive/-- (07/24 0000) Antibody: Negative (07/24 0000) Rubella: Immune (07/24 0000) RPR: Nonreactive (07/24 0000)  HBsAg: Negative (07/24 0000)  HIV: Non-reactive (07/24 0000)  GBS:     Assessment/Plan: 35 w 6d IUP SROM GBS unknown Admit , augment, IV ABX   Magalene Mclear J 06/22/2014, 4:58 PM

## 2014-06-23 ENCOUNTER — Encounter (HOSPITAL_COMMUNITY): Payer: Self-pay | Admitting: *Deleted

## 2014-06-23 LAB — RPR

## 2014-06-23 MED ORDER — BENZOCAINE-MENTHOL 20-0.5 % EX AERO
1.0000 "application " | INHALATION_SPRAY | CUTANEOUS | Status: DC | PRN
  Administered 2014-06-23: 1 via TOPICAL
  Filled 2014-06-23: qty 56

## 2014-06-23 MED ORDER — PRENATAL MULTIVITAMIN CH
1.0000 | ORAL_TABLET | Freq: Every day | ORAL | Status: DC
  Administered 2014-06-23 – 2014-06-24 (×2): 1 via ORAL
  Filled 2014-06-23 (×2): qty 1

## 2014-06-23 MED ORDER — OXYCODONE-ACETAMINOPHEN 5-325 MG PO TABS
1.0000 | ORAL_TABLET | ORAL | Status: DC | PRN
Start: 2014-06-23 — End: 2014-06-25

## 2014-06-23 MED ORDER — IBUPROFEN 600 MG PO TABS
600.0000 mg | ORAL_TABLET | Freq: Four times a day (QID) | ORAL | Status: DC
  Administered 2014-06-23 – 2014-06-25 (×8): 600 mg via ORAL
  Filled 2014-06-23 (×8): qty 1

## 2014-06-23 MED ORDER — ONDANSETRON HCL 4 MG PO TABS: 4.0000 mg | ORAL_TABLET | ORAL | Status: DC | PRN

## 2014-06-23 MED ORDER — DIPHENHYDRAMINE HCL 25 MG PO CAPS: 25.0000 mg | ORAL_CAPSULE | Freq: Four times a day (QID) | ORAL | Status: DC | PRN

## 2014-06-23 MED ORDER — METHYLERGONOVINE MALEATE 0.2 MG/ML IJ SOLN: 0.2000 mg | INTRAMUSCULAR | Status: DC | PRN

## 2014-06-23 MED ORDER — ZOLPIDEM TARTRATE 5 MG PO TABS: 5.0000 mg | ORAL_TABLET | Freq: Every evening | ORAL | Status: DC | PRN

## 2014-06-23 MED ORDER — ONDANSETRON HCL 4 MG/2ML IJ SOLN: 4.0000 mg | INTRAMUSCULAR | Status: DC | PRN

## 2014-06-23 MED ORDER — METHYLERGONOVINE MALEATE 0.2 MG PO TABS: 0.2000 mg | ORAL_TABLET | ORAL | Status: DC | PRN

## 2014-06-23 MED ORDER — DIBUCAINE 1 % RE OINT: 1.0000 "application " | TOPICAL_OINTMENT | RECTAL | Status: DC | PRN

## 2014-06-23 MED ORDER — WITCH HAZEL-GLYCERIN EX PADS: 1.0000 "application " | MEDICATED_PAD | CUTANEOUS | Status: DC | PRN

## 2014-06-23 MED ORDER — SENNOSIDES-DOCUSATE SODIUM 8.6-50 MG PO TABS
2.0000 | ORAL_TABLET | ORAL | Status: DC
  Administered 2014-06-24: 2 via ORAL
  Filled 2014-06-23: qty 2

## 2014-06-23 MED ORDER — TETANUS-DIPHTH-ACELL PERTUSSIS 5-2.5-18.5 LF-MCG/0.5 IM SUSP: 0.5000 mL | Freq: Once | INTRAMUSCULAR | Status: DC

## 2014-06-23 MED ORDER — SIMETHICONE 80 MG PO CHEW
80.0000 mg | CHEWABLE_TABLET | ORAL | Status: DC | PRN
Start: 2014-06-23 — End: 2014-06-25

## 2014-06-23 MED ORDER — LANOLIN HYDROUS EX OINT: TOPICAL_OINTMENT | CUTANEOUS | Status: DC | PRN

## 2014-06-23 NOTE — Anesthesia Postprocedure Evaluation (Signed)
Anesthesia Post Note  Patient: Leah Davis  Procedure(s) Performed: * No procedures listed *  Anesthesia type: Epidural  Patient location: Mother/Baby  Post pain: Pain level controlled  Post assessment: Post-op Vital signs reviewed  Last Vitals:  Filed Vitals:   06/23/14 0910  BP: 140/57  Pulse: 75  Temp: 37.6 C  Resp: 18    Post vital signs: Reviewed  Level of consciousness:alert  Complications: No apparent anesthesia complications Last Vitals:  Filed Vitals:   06/23/14 0910  BP: 140/57  Pulse: 75  Temp: 37.6 C  Resp: 18    Complications:

## 2014-06-23 NOTE — Lactation Note (Signed)
This note was copied from the chart of Leah Davis Mccrackin. Lactation Consultation Note  Patient Name: Leah Davis Jungbluth AVWUJ'WToday's Date: 06/23/2014 Reason for consult: Follow-up assessment;Late preterm infant FOB was supplementing with formula when I arrived but baby not suckling well on bottle. LC demonstrated how to use bottle and demonstrated some paced feedings. Reviewed LPT behaviors again and reviewed plan to BF with each feeding, limit time at breast to 30 minutes to conserve calorie usage with BF, Mom to pump for 15 minutes while FOB give supplements per guidelines. Parents were agreeable to this plan. Explained rationale behind feeding plan and answered questions. Mom reports nipple tenderness. Advised to apply EBM and comfort gels given with instructions. Encouraged Mom to call for RN to observe latch due to nipple tenderness.   Maternal Data    Feeding Feeding Type: Formula Length of feed: 40 min (off and on)  LATCH Score/Interventions                      Lactation Tools Discussed/Used     Consult Status Consult Status: Follow-up Date: 06/24/14 Follow-up type: In-patient    Alfred LevinsGranger, Marvette Schamp Ann 06/23/2014, 10:39 PM

## 2014-06-23 NOTE — Progress Notes (Signed)
Patient ID: Jinny Blossomicole P Devincenzi, female   DOB: 09/12/1986, 28 y.o.   MRN: [id number] INTERVAL NOTE:  S:   Sitting in bed, BFing with no difficulties, min cramping, (+) voids, small bleed, denies HA/NV/dizziness  Circumcision planning O:   VSS, AAO x 3, NAD  U even  Scant lochia  A / P:   PPD #0  Stable post partum  Routine PP orders  Kenard GowerAWSON, Kaley Jutras, M, MSN, CNM 06/23/2014, 1:39 PM

## 2014-06-24 LAB — CBC
HCT: 38 % (ref 36.0–46.0)
Hemoglobin: 12.8 g/dL (ref 12.0–15.0)
MCH: 31.1 pg (ref 26.0–34.0)
MCHC: 33.7 g/dL (ref 30.0–36.0)
MCV: 92.2 fL (ref 78.0–100.0)
PLATELETS: 144 10*3/uL — AB (ref 150–400)
RBC: 4.12 MIL/uL (ref 3.87–5.11)
RDW: 13 % (ref 11.5–15.5)
WBC: 14.4 10*3/uL — ABNORMAL HIGH (ref 4.0–10.5)

## 2014-06-24 NOTE — Lactation Note (Signed)
This note was copied from the chart of Boy Gretchen Shorticole Jiggetts. Lactation Consultation Note  Patient Name: Boy Gretchen Shorticole Torre ZOXWR'UToday's Date: 06/24/2014 Reason for consult: Follow-up assessment;Late preterm infant Parents report feeding plan is going well, however, Mom is not pumping consistently per her report. She is starting to get a few drops of colostrum with pumping. LC stressed importance of pumping to bring milk in well since baby is LPT. Assisted Mom with latch at this visit, she had baby in football hold but LC noted shallow latch. LC assisted Mom with positioning to obtain more depth. Baby demonstrates a good suckling pattern. Advised to continue feeding plan till milk comes in well and baby is back to birth weight. Advised to call for questions or concerns.   Maternal Data    Feeding Feeding Type: Breast Fed Length of feed: 10 min  LATCH Score/Interventions Latch: Grasps breast easily, tongue down, lips flanged, rhythmical sucking. Intervention(s): Adjust position;Assist with latch;Breast massage;Breast compression  Audible Swallowing: A few with stimulation  Type of Nipple: Everted at rest and after stimulation  Comfort (Breast/Nipple): Soft / non-tender     Hold (Positioning): Assistance needed to correctly position infant at breast and maintain latch.  LATCH Score: 8  Lactation Tools Discussed/Used Tools: Pump Breast pump type: Double-Electric Breast Pump   Consult Status Consult Status: Follow-up Date: 06/25/14 Follow-up type: In-patient    Alfred LevinsGranger, Ascher Schroepfer Ann 06/24/2014, 11:17 PM

## 2014-06-24 NOTE — Progress Notes (Signed)
Patient ID: Leah Davis, female   DOB: 04/30/1986, 28 y.o.   MRN: [id number] PPD # 1 SVD  S:  Reports feeling tired, but well             Tolerating po/ No nausea or vomiting             Bleeding is light             Pain controlled with ibuprofen (OTC) and narcotic analgesics including Percocet             Up ad lib / ambulatory / voiding without difficulties    Newborn  Information for the patient's newborn:  Roberts GaudyMincello, Boy Ramonia [[id number]][[id number]]  female  breast feeding  / Circumcision planning   O:  A & O x 3, in no apparent distress              VS:  Filed Vitals:   06/23/14 0810 06/23/14 0910 06/23/14 1900 06/24/14 0547  BP: 157/77 140/57 144/72 132/62  Pulse: 84 75 69 64  Temp: 99 F (37.2 C) 99.6 F (37.6 C) 97.4 F (36.3 C)   TempSrc: Oral Oral Oral   Resp: 18 18 18 16   Height:      Weight:      SpO2: 98% 99%      LABS:  Recent Labs  06/22/14 1630 06/24/14 0610  WBC 11.9* 14.4*  HGB 13.4 12.8  HCT 38.8 38.0  PLT 165 144*    Blood type: A POS, A POS (07/24 1630)  Rubella: Immune (07/24 0000)     Lungs: Clear and unlabored  Heart: regular rate and rhythm / no murmurs  Abdomen: soft, non-tender, non-distended              Fundus: firm, non-tender, U-2  Perineum: 2nd degree repair healing well  Lochia: scant  Extremities: no edema, no calf pain or tenderness, no Homans    A/P: PPD # 1  28 y.o., G1P0101   Principal Problem:   Postpartum care following vaginal delivery (7/25) Active Problems:   Normal labor   Doing well - stable status  Routine post partum orders  Anticipate discharge tomorrow   Raelyn MoraAWSON, Colten Desroches, M, MSN, CNM 06/24/2014, 7:33 AM

## 2014-06-25 ENCOUNTER — Encounter (HOSPITAL_COMMUNITY)
Admission: RE | Admit: 2014-06-25 | Discharge: 2014-06-25 | Disposition: A | Discharge status: Home / Self Care | Payer: BC Managed Care – PPO | Source: Ambulatory Visit | Attending: Obstetrics and Gynecology | Admitting: Obstetrics and Gynecology

## 2014-06-25 DIAGNOSIS — O923 Agalactia: Secondary | ICD-10-CM | POA: Insufficient documentation

## 2014-06-25 MED ORDER — IBUPROFEN 600 MG PO TABS: 600.0000 mg | ORAL_TABLET | Freq: Four times a day (QID) | ORAL | Status: DC

## 2014-06-25 NOTE — Progress Notes (Signed)
PPD #2- SVD  Subjective:   Reports feeling well, ready for discharge Tolerating po/ No nausea or vomiting Some numbness in hands/no worse than during pregnancy Bleeding is light Pain controlled with Motrin Up ad lib / ambulatory / voiding without problems Newborn: breastfeeding  / Circumcision: done   Objective:   VS: VS:  Filed Vitals:   06/23/14 1900 06/24/14 0547 06/24/14 1805 06/25/14 0623  BP: 144/72 132/62 139/80 144/85  Pulse: 69 64 64 67  Temp: 97.4 F (36.3 C)  98.4 F (36.9 C) 98 F (36.7 C)  TempSrc: Oral  Oral Oral  Resp: 18 16 18 20   Height:      Weight:      SpO2:        LABS:  Recent Labs  06/22/14 1630 06/24/14 0610  WBC 11.9* 14.4*  HGB 13.4 12.8  PLT 165 144*   Blood type: --/--/A POS, A POS (07/24 1630) Rubella: Immune (07/24 0000)                I&O: Intake/Output   None     Physical Exam: Alert and oriented X3 Abdomen: soft, non-tender, non-distended  Fundus: firm, non-tender, U-2 Perineum: Well approximated, no significant erythema, edema, or drainage; healing well. Lochia: small Extremities: No edema, no calf pain or tenderness    Assessment: PPD # 2 G1P0101/ S/P:spontaneous vaginal, 2nd degree laceration Carpel tunnel syndrome Thrombocytopenia-d/t blood loss-stable Doing well - stable for discharge home   Plan: Discharge home RX's:  Ibuprofen 600mg  po Q 6 hrs prn pain #30 Refill x 0 Bilateral wrist splints at hs Routine pp visit in [employer] [facility] booklet given    Donette LarryBHAMBRI, Nicholette Dolson, N MSN, CNM 06/25/2014, 10:34 AM

## 2014-06-25 NOTE — Lactation Note (Signed)
This note was copied from the chart of Leah Gretchen Shorticole Leppert. Lactation Consultation Note 0900: Discussed mom's feeding plan; mom is breastfeeding then supplementing with pumped breast milk when available or pregistimil. Mom is pumping. Mom request more comfort gels, provided at her request.. Mom states feeds are going well according to the plan. Mom needs a rental pump. Packet provided.   1045: Return to mom with rental pump. Baby starting to show feeding cues. Mom placed baby on right football, LC assist with pillow support. Baby latches well with audible swallows, mom comfortable.   Mom was instructed in the prevention and treatment of engorgement and sore nipples. Mom was encouraged to call lactation department if she has any questions or concerns and to attend the breastfeeding support group.   Patient Name: Leah Davis: 06/25/2014 Reason for consult: Follow-up assessment;Infant < 6lbs;Late preterm infant   Maternal Data    Feeding Feeding Type: Breast Fed Length of feed: 20 min  LATCH Score/Interventions Latch: Grasps breast easily, tongue down, lips flanged, rhythmical sucking.  Audible Swallowing: Spontaneous and intermittent  Type of Nipple: Everted at rest and after stimulation  Comfort (Breast/Nipple): Soft / non-tender     Hold (Positioning): Assistance needed to correctly position infant at breast and maintain latch. Intervention(s): Support Pillows  LATCH Score: 9  Lactation Tools Discussed/Used     Consult Status Consult Status: PRN    Lenard ForthSanders, Labrandon Knoch Fulmer 06/25/2014, 11:03 AM

## 2014-06-25 NOTE — Discharge Summary (Signed)
Obstetric Discharge Summary Reason for Admission: onset of labor Prenatal Course: complicated by migraines-managed with Fiorocet Intrapartum Procedures: spontaneous vaginal delivery Postpartum Procedures: none Complications-Operative and Postpartum: 2nd degree perineal laceration HGB  Date Value Ref Range Status  02/23/2014 12.4  11.6 - 15.9 g/dL Final     Hemoglobin  Date Value Ref Range Status  06/24/2014 12.8  12.0 - 15.0 g/dL Final     HCT  Date Value Ref Range Status  06/24/2014 38.0  36.0 - 46.0 % Final  02/23/2014 36.6  34.8 - 46.6 % Final    Physical Exam:  General: alert and cooperative Lochia: appropriate Uterine Fundus: firm Incision: healing well, no significant drainage, no dehiscence, no significant erythema DVT Evaluation: No evidence of DVT seen on physical exam. Negative Homan's sign. No cords or calf tenderness. No significant calf/ankle edema.  Discharge Diagnoses: Term Pregnancy-delivered  Discharge Information: Date: 06/25/2014 Activity: pelvic rest Diet: routine Medications: PNV and Ibuprofen Condition: stable Instructions: refer to practice specific booklet Discharge to: home Follow-up Information   Follow up with Lenoard AdenAAVON,RICHARD J, MD. Schedule an appointment as soon as possible for a visit in 6 weeks.   Specialty:  Obstetrics and Gynecology   Contact information:   [address] Severe[address] [place] [id number] [phone]       Newborn Data: Live born female on 06/23/14 Birth Weight: 6 lb 3.6 oz (2824 g) APGAR: 8, 9  Home with mother.  Alba Perillo, N 06/25/2014, 12:05 PM

## 2014-10-01 ENCOUNTER — Encounter (HOSPITAL_COMMUNITY): Payer: Self-pay | Admitting: *Deleted

## 2014-11-13 ENCOUNTER — Telehealth: Payer: Self-pay | Admitting: Hematology and Oncology

## 2014-11-13 NOTE — Telephone Encounter (Signed)
, °

## 2015-02-19 ENCOUNTER — Telehealth: Payer: Self-pay | Admitting: Hematology and Oncology

## 2015-02-19 NOTE — Telephone Encounter (Signed)
Called and left message with new appt  °

## 2015-02-19 NOTE — Telephone Encounter (Signed)
Patient called in to r/s her appt to 4/29

## 2015-03-04 ENCOUNTER — Ambulatory Visit: Payer: BC Managed Care – PPO | Admitting: Hematology and Oncology

## 2015-03-14 ENCOUNTER — Telehealth: Payer: Self-pay | Admitting: *Deleted

## 2015-03-14 ENCOUNTER — Telehealth: Payer: Self-pay | Admitting: Oncology

## 2015-03-14 ENCOUNTER — Ambulatory Visit: Payer: Self-pay | Admitting: Hematology and Oncology

## 2015-03-14 NOTE — Telephone Encounter (Signed)
TC from patient to clarify her appt with Dr. Darnelle CatalanMagrinat. Appt is set for 05/15/15 at 11am. Informed pt of this and she verbalized understanding.

## 2015-03-14 NOTE — Telephone Encounter (Signed)
pt cld wanting to chge to GM per Val notes she had left message for pt to sch-gave pt time & date-pt understood

## 2015-03-14 NOTE — Telephone Encounter (Signed)
This RN received message stating pt would like to be seen for follow up of high risk by Dr Thea SilversmithGM.  Pt 's mother Benita Stabile( Caroline Pittman) is a patient of Dr Thea SilversmithGM and per Dr Milta DeitersKhan's leave pt now would like to be followed by Dr Thea SilversmithGM.  Pt is currently scheduled for 4/29 with Dr Pamelia HoitGudena.  This RN left message on identified VM for pt stating request received with information that Dr Zebedee IbaGM's next available is not until late May- June. Requested for pt to return call and speak with Baltazar ApoLisa Morrison if she wants to reschedule appointment.  This RN also requested if pt has any current concerns to speak with this RN.  This note will be sent to MD for review.

## 2015-03-15 ENCOUNTER — Ambulatory Visit: Payer: Self-pay | Admitting: Women's Health

## 2015-03-15 ENCOUNTER — Ambulatory Visit: Payer: Self-pay | Admitting: Hematology and Oncology

## 2015-03-29 ENCOUNTER — Ambulatory Visit: Payer: Self-pay | Admitting: Hematology and Oncology

## 2015-05-01 ENCOUNTER — Ambulatory Visit: Payer: Self-pay | Admitting: Oncology

## 2015-05-15 ENCOUNTER — Other Ambulatory Visit: Payer: Self-pay | Admitting: Oncology

## 2015-05-15 ENCOUNTER — Telehealth: Payer: Self-pay | Admitting: Oncology

## 2015-05-15 ENCOUNTER — Ambulatory Visit (HOSPITAL_BASED_OUTPATIENT_CLINIC_OR_DEPARTMENT_OTHER): Payer: BLUE CROSS/BLUE SHIELD | Admitting: Oncology

## 2015-05-15 VITALS — BP 140/71 | HR 64 | Temp 98.9°F | Resp 18 | Ht 66.0 in | Wt 161.6 lb

## 2015-05-15 DIAGNOSIS — Z1509 Genetic susceptibility to other malignant neoplasm: Principal | ICD-10-CM

## 2015-05-15 DIAGNOSIS — Z1501 Genetic susceptibility to malignant neoplasm of breast: Secondary | ICD-10-CM

## 2015-05-15 DIAGNOSIS — Z1231 Encounter for screening mammogram for malignant neoplasm of breast: Secondary | ICD-10-CM

## 2015-05-15 DIAGNOSIS — Z803 Family history of malignant neoplasm of breast: Secondary | ICD-10-CM

## 2015-05-15 NOTE — Telephone Encounter (Signed)
Gave avs & calendar for June 2017. Patient will call and schedule MRI & Mammo.

## 2015-05-15 NOTE — Progress Notes (Signed)
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 This is one of the common Ashkenazi mutations.  INTERVAL HISTORY: Since her last visit here with my former partner Dr. Humphrey Rolls,  Leah Davis has undergone in vitro fertilization and now has a 46 year old son. She and her husband have 5 embryos available who are being tested for the BRCA and other possible mutations. She is considering going through the implantation procedure again sometime in the next year or year and a half.  REVIEW OF SYSTEMS:  A detailed review of systems today was otherwise noncontributory  PAST MEDICAL HISTORY: Past Medical History  Diagnosis Date  . Migraine     WITHOUT AURA  . PMS (premenstrual syndrome)   . 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  Marland Kitchen BRCA1 positive 2014  . Pregnancy 02/24/2014  . Postpartum care following vaginal delivery (7/25) 06/23/2014    PAST SURGICAL HISTORY: Past Surgical History  Procedure Laterality Date  . Wisdom tooth extraction      FAMILY HISTORY Family History  Problem Relation Age of Onset  . Breast cancer Mother 53  . Breast cancer Maternal Grandmother     diagnosed age 32-44    GYNECOLOGIC HISTORY:  No LMP recorded. GX P1. Her periods are regular. She and her husband are not using any contraception at present,  but have been told he would be "very unlikely" that she would become pregnant given their prior history.  SOCIAL HISTORY:  Leah Davis works as a Copywriter, advertising. Her husband Leah Davis works for    . The patient attends a local [employer]    ADVANCED DIRECTIVES:  Not in place   HEALTH MAINTENANCE: History  Substance Use Topics  . Smoking status: Never Smoker   . Smokeless tobacco: Never Used  . Alcohol Use: Yes     Comment: occassionally     Colonoscopy:  PAP:  Bone density:  Lipid panel:  No Known Allergies  Current Outpatient Prescriptions  Medication Sig Dispense Refill  . butalbital-acetaminophen-caffeine (FIORICET, ESGIC) 50-325-40 MG per tablet Take 1 tablet by mouth 2 (two) times daily as needed for headache or migraine.    Marland Kitchen ibuprofen (ADVIL,MOTRIN) 600 MG tablet Take 1 tablet (600 mg total) by mouth every 6 (six) hours. 30 tablet 0  . Multiple Vitamin (MULTIVITAMIN) capsule Take 1 capsule by mouth daily.       No current facility-administered medications for this visit.    OBJECTIVE: general white woman who appears well Filed Vitals:   05/15/15 1040  BP: 140/71  Pulse: 64  Temp: 98.9 F (37.2 C)  Resp: 18     Body mass index is 26.1 kg/(m^2).    ECOG FS:0 - Asymptomatic  (a Administrator, arts was present for the physical exam)  Ocular: Sclerae unicteric, pupils equal, round and reactive to light Ear-nose-throat: Oropharynx clear, dentition  in excellent repairLymphatic: No cervical or supraclavicular  adenopathy Lungs no rales or rhonchi, good excursion bilaterally Heart regular rate and rhythm, no murmur appreciated Abd soft, nontender, positive bowel sounds, no masses palpated  MSK no focal spinal tenderness, no joint edema Neuro: non-focal, well-oriented, appropriate affect Breasts:  No masses noted in either breast. There are no skin or nipple changes of concern. Both axillae are benign   LAB RESULTS:  CMP     Component Value Date/Time   NA 136*  06/22/2014 1630   NA 138 02/23/2014 1514   K 4.1 06/22/2014 1630   K 3.7 02/23/2014 1514   CL 102 06/22/2014 1630   CL 107 12/16/2012 1449   CO2 20 06/22/2014 1630   CO2 20* 02/23/2014 1514   GLUCOSE 88 06/22/2014 1630   GLUCOSE 95 02/23/2014 1514   GLUCOSE 121* 12/16/2012 1449   BUN 7 06/22/2014 1630   BUN 9.3 02/23/2014 1514   CREATININE 0.62 06/22/2014 1630   CREATININE 0.7 02/23/2014 1514   CALCIUM 9.4 06/22/2014 1630   CALCIUM 9.1 02/23/2014 1514   PROT 6.2 06/22/2014 1630   PROT 6.7 02/23/2014 1514   ALBUMIN 2.8* 06/22/2014 1630   ALBUMIN 3.5 02/23/2014 1514   AST 18 06/22/2014 1630   AST 14 02/23/2014 1514   ALT 12 06/22/2014 1630   ALT 12 02/23/2014 1514   ALKPHOS 134* 06/22/2014 1630   ALKPHOS 43 02/23/2014 1514   BILITOT 0.2* 06/22/2014 1630   BILITOT 0.21 02/23/2014 1514   GFRNONAA >90 06/22/2014 1630   GFRAA >90 06/22/2014 1630    INo results found for: SPEP, UPEP  Lab Results  Component Value Date   WBC 14.4* 06/24/2014   NEUTROABS 8.8* 02/23/2014   HGB 12.8 06/24/2014   HCT 38.0 06/24/2014   MCV 92.2 06/24/2014   PLT 144* 06/24/2014      Chemistry      Component Value Date/Time   NA 136* 06/22/2014 1630   NA 138 02/23/2014 1514   K 4.1 06/22/2014 1630   K 3.7 02/23/2014 1514   CL 102 06/22/2014 1630   CL 107 12/16/2012 1449   CO2 20 06/22/2014 1630   CO2 20* 02/23/2014 1514   BUN 7 06/22/2014 1630   BUN 9.3 02/23/2014 1514   CREATININE 0.62 06/22/2014 1630   CREATININE 0.7 02/23/2014 1514      Component Value Date/Time   CALCIUM 9.4 06/22/2014 1630   CALCIUM 9.1 02/23/2014 1514   ALKPHOS 134* 06/22/2014 1630   ALKPHOS 43 02/23/2014 1514   AST 18 06/22/2014 1630   AST 14 02/23/2014 1514   ALT 12 06/22/2014 1630   ALT 12 02/23/2014 1514   BILITOT 0.2* 06/22/2014 1630   BILITOT 0.21 02/23/2014 1514       No results found for: LABCA2  No components found for: LABCA125  No results for input(s): INR in the last 168  hours.  Urinalysis No results found for: COLORURINE, APPEARANCEUR, LABSPEC, PHURINE, GLUCOSEU, HGBUR, BILIRUBINUR, KETONESUR, PROTEINUR, UROBILINOGEN, NITRITE, LEUKOCYTESUR  STUDIES:  most recent mammography (12/07/2012) and bilateral breast MRI (02/22/2013) were reviewed  ASSESSMENT: 29 y.o.  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 We cannot screen for ovarian cancer, or more exactly we do not have data that screening for ovarian cancer saves lives. Accordingly at some point she will have to undergo  bilateral salpingo-oophorectomy.   Exactly what that point should be is the question, but for many patients the procedure is performed between 28 and 77 years old. The concern of course is the consequences of premature menopause. If we take a 44% risk of ovarian cancer for these patients over a lifetime, and consider that in the general population less than 10% of ovarian cancers develop in women under 1, then the risk of ovarian cancer developing in new call over the next 12 years is probably in the 5% range. This is acceptable to her, at least at this point, and of course in any case she is planning on having embryo implantation sometime within the next 2 years  We do know how to screen for breast cancer. The problem there is cost. That is the reason she has not had a breast MRI for 2 years. She does have new insurance so possibly this may become feasible. Otherwise we would have to consider bilateral mastectomies at some point.  A reasonable plan for now is to proceed to mammography with tomography and then breast MRI late this year. She will see me again a year from now and of course in the meantime she will continue clinical breast exam on a regular basis.  We also discussed the  use of tamoxifen. This is not a contraceptive, and if she were to become pregnant it would have to be stopped. We have very little data regarding what happens to embryos exposed to tamoxifen in utero. At this point, since they are not using any form of contraception, and pregnancy is possible though unlikely, I would not recommend she start tamoxifen. If she wishes to use this agent, which will significantly reduce the risk of breast cancer developing, I would suggest a copper IUD or Mirena for contraception.  Leah Davis  has a good understanding of the overall plan. She agrees with it. She knows the goal of treatment in her case is  prevention. She will call with any problems that may develop before her next visit here.  Chauncey Cruel, MD   05/15/2015 7:26 PM Medical Oncology and Hematology [facility] [address] [place],  [address] Tel. [phone]    Fax. [phone]

## 2015-06-07 ENCOUNTER — Ambulatory Visit
Admission: RE | Admit: 2015-06-07 | Discharge: 2015-06-07 | Disposition: A | Discharge status: Home / Self Care | Payer: BLUE CROSS/BLUE SHIELD | Source: Ambulatory Visit | Attending: Oncology | Admitting: Oncology

## 2015-06-07 DIAGNOSIS — Z1509 Genetic susceptibility to other malignant neoplasm: Principal | ICD-10-CM

## 2015-06-07 DIAGNOSIS — Z1501 Genetic susceptibility to malignant neoplasm of breast: Secondary | ICD-10-CM

## 2015-06-07 DIAGNOSIS — Z1231 Encounter for screening mammogram for malignant neoplasm of breast: Secondary | ICD-10-CM

## 2015-07-01 ENCOUNTER — Telehealth: Payer: Self-pay | Admitting: *Deleted

## 2015-07-01 NOTE — Telephone Encounter (Signed)
"  Dr. Darnelle Catalan told me I need an MRI breast.  Do I call or do you all?  Do I need to wait six months before ai have this done."  [facility] staff.  MRI should be within three months of Mammogram.  Patient notified to call [facility] at [address] [place] to schedule in ealy October.

## 2015-07-17 ENCOUNTER — Other Ambulatory Visit: Payer: BLUE CROSS/BLUE SHIELD

## 2016-03-10 DIAGNOSIS — Z319 Encounter for procreative management, unspecified: Secondary | ICD-10-CM | POA: Diagnosis not present

## 2016-03-27 DIAGNOSIS — Z3141 Encounter for fertility testing: Secondary | ICD-10-CM | POA: Diagnosis not present

## 2016-03-27 DIAGNOSIS — N979 Female infertility, unspecified: Secondary | ICD-10-CM | POA: Diagnosis not present

## 2016-05-13 ENCOUNTER — Telehealth: Payer: Self-pay | Admitting: Oncology

## 2016-05-13 ENCOUNTER — Ambulatory Visit (HOSPITAL_BASED_OUTPATIENT_CLINIC_OR_DEPARTMENT_OTHER): Payer: BLUE CROSS/BLUE SHIELD | Admitting: Oncology

## 2016-05-13 ENCOUNTER — Ambulatory Visit (HOSPITAL_BASED_OUTPATIENT_CLINIC_OR_DEPARTMENT_OTHER): Payer: BLUE CROSS/BLUE SHIELD

## 2016-05-13 ENCOUNTER — Encounter: Payer: Self-pay | Admitting: Oncology

## 2016-05-13 VITALS — BP 140/88 | HR 74 | Temp 99.1°F | Resp 18 | Ht 66.0 in | Wt 159.1 lb

## 2016-05-13 DIAGNOSIS — Z1509 Genetic susceptibility to other malignant neoplasm: Principal | ICD-10-CM

## 2016-05-13 DIAGNOSIS — Z1501 Genetic susceptibility to malignant neoplasm of breast: Secondary | ICD-10-CM

## 2016-05-13 DIAGNOSIS — Z803 Family history of malignant neoplasm of breast: Secondary | ICD-10-CM

## 2016-05-13 LAB — COMPREHENSIVE METABOLIC PANEL
ALT: 9 U/L (ref 0–55)
AST: 14 U/L (ref 5–34)
Albumin: 4.4 g/dL (ref 3.5–5.0)
Alkaline Phosphatase: 40 U/L (ref 40–150)
Anion Gap: 6 mEq/L (ref 3–11)
BILIRUBIN TOTAL: 0.51 mg/dL (ref 0.20–1.20)
BUN: 9 mg/dL (ref 7.0–26.0)
CO2: 25 meq/L (ref 22–29)
CREATININE: 0.9 mg/dL (ref 0.6–1.1)
Calcium: 9.6 mg/dL (ref 8.4–10.4)
Chloride: 108 mEq/L (ref 98–109)
EGFR: >90 mL/min/{1.73_m2} (ref >90)
GLUCOSE: 86 mg/dL (ref 70–140)
Potassium: 4.8 mEq/L (ref 3.5–5.1)
SODIUM: 139 meq/L (ref 136–145)
TOTAL PROTEIN: 7.6 g/dL (ref 6.4–8.3)

## 2016-05-13 LAB — CBC WITH DIFFERENTIAL/PLATELET
BASO%: 0.4 % (ref 0.0–2.0)
Basophils Absolute: 0 10*3/uL (ref 0.0–0.1)
EOS%: 2.3 % (ref 0.0–7.0)
Eosinophils Absolute: 0.1 10*3/uL (ref 0.0–0.5)
HCT: 42.5 % (ref 34.8–46.6)
HGB: 14 g/dL (ref 11.6–15.9)
LYMPH%: 42 % (ref 14.0–49.7)
MCH: 29.5 pg (ref 25.1–34.0)
MCHC: 32.9 g/dL (ref 31.5–36.0)
MCV: 89.7 fL (ref 79.5–101.0)
MONO#: 0.3 10*3/uL (ref 0.1–0.9)
MONO%: 5.6 % (ref 0.0–14.0)
NEUT#: 2.8 10*3/uL (ref 1.5–6.5)
NEUT%: 49.7 % (ref 38.4–76.8)
Platelets: 181 10*3/uL (ref 145–400)
RBC: 4.74 10*6/uL (ref 3.70–5.45)
RDW: 13.5 % (ref 11.2–14.5)
WBC: 5.7 10*3/uL (ref 3.9–10.3)
lymph#: 2.4 10*3/uL (ref 0.9–3.3)

## 2016-05-13 NOTE — Progress Notes (Signed)
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 This is one of the common Ashkenazi mutations.  INTERVAL HISTORY: Leah Davis is generally doing well. She is considering going through a second implant procedure (they have 5 embryos frozen) sometime this year. She also turns 30 this year. This is when we usually start or intensive ovarian cancer screening  She was not able to get the breast MRI. For by her insurance that she has not had gone since March 2014. She is due for repeat mammography this July  REVIEW OF SYSTEMS: Aside from her migraines, a detailed review of systems today was noncontributory  PAST MEDICAL HISTORY: Past Medical History  Diagnosis Date  . Migraine     WITHOUT AURA  . PMS (premenstrual syndrome)   . 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  Marland Kitchen BRCA1 positive 2014  . Pregnancy 02/24/2014  . Postpartum care following vaginal delivery (7/25) 06/23/2014    PAST SURGICAL HISTORY: Past Surgical History  Procedure Laterality Date  . Wisdom tooth extraction      FAMILY HISTORY Family History  Problem Relation Age of Onset  . Breast cancer Mother 64  . Breast cancer Maternal Grandmother     diagnosed age 56-44    GYNECOLOGIC HISTORY:  No LMP recorded. GX P1. Her periods are regular. She  and her husband are not using any contraception at present, but have been told he would be "very unlikely" that she would become pregnant given their prior history.  SOCIAL HISTORY:  Leah Davis works as a Copywriter, advertising. Her husband Ronalee Belts works for    . The patient attends a local [employer]    ADVANCED DIRECTIVES:  Not in place   HEALTH MAINTENANCE: Social History  Substance Use Topics  . Smoking status: Never Smoker   . Smokeless tobacco: Never Used  . Alcohol Use: Yes     Comment: occassionally     Colonoscopy:  PAP:  Bone density:  Lipid panel:  No Known Allergies  Current Outpatient Prescriptions  Medication Sig Dispense Refill  . butalbital-acetaminophen-caffeine (FIORICET, ESGIC) 50-325-40 MG per tablet Take 1 tablet by mouth 2 (two) times daily as needed for headache or migraine.    Marland Kitchen ibuprofen (ADVIL,MOTRIN) 600 MG tablet Take 1 tablet (600 mg total) by mouth every 6 (six) hours. 30 tablet 0  . Multiple Vitamin (MULTIVITAMIN) capsule Take 1 capsule by mouth daily.       No current facility-administered medications for this visit.    OBJECTIVE: general white woman In no acute distress  Filed Vitals:   05/13/16 0848  BP: 140/88  Pulse: 74  Temp: 99.1 F (37.3 C)  Resp: 18     Body mass index is 25.69 kg/(m^2).    ECOG FS:0 - Asymptomatic  Sclerae unicteric, pupils round and equal Oropharynx clear and moist-- no thrush or other lesions No cervical or supraclavicular  adenopathy Lungs no rales or rhonchi Heart regular rate and rhythm Abd soft, nontender, positive bowel sounds MSK no focal spinal tenderness, no upper extremity lymphedema Neuro: nonfocal, well oriented, appropriate affect Breasts: I do not palpate any suspicious masses in either breast. Both axillae are benign.    LAB RESULTS:  CMP     Component Value Date/Time   NA 136* 06/22/2014 1630   NA 138 02/23/2014 1514   K 4.1 06/22/2014 1630   K 3.7 02/23/2014 1514   CL 102 06/22/2014  1630   CL 107 12/16/2012 1449   CO2 20 06/22/2014 1630   CO2 20* 02/23/2014 1514   GLUCOSE 88 06/22/2014 1630   GLUCOSE 95 02/23/2014 1514   GLUCOSE 121* 12/16/2012 1449   BUN 7 06/22/2014 1630   BUN 9.3 02/23/2014 1514   CREATININE 0.62 06/22/2014 1630   CREATININE 0.7 02/23/2014 1514   CALCIUM 9.4 06/22/2014 1630   CALCIUM 9.1 02/23/2014 1514   PROT 6.2 06/22/2014 1630   PROT 6.7 02/23/2014 1514   ALBUMIN 2.8* 06/22/2014 1630   ALBUMIN 3.5 02/23/2014 1514   AST 18 06/22/2014 1630   AST 14 02/23/2014 1514   ALT 12 06/22/2014 1630   ALT 12 02/23/2014 1514   ALKPHOS 134* 06/22/2014 1630   ALKPHOS 43 02/23/2014 1514   BILITOT 0.2* 06/22/2014 1630   BILITOT 0.21 02/23/2014 1514   GFRNONAA >90 06/22/2014 1630   GFRAA >90 06/22/2014 1630    INo results found for: SPEP, UPEP  Lab Results  Component Value Date   WBC 14.4* 06/24/2014   NEUTROABS 8.8* 02/23/2014   HGB 12.8 06/24/2014   HCT 38.0 06/24/2014   MCV 92.2 06/24/2014   PLT 144* 06/24/2014      Chemistry      Component Value Date/Time   NA 136* 06/22/2014 1630   NA 138 02/23/2014 1514   K 4.1 06/22/2014 1630   K 3.7 02/23/2014 1514   CL 102 06/22/2014 1630   CL 107 12/16/2012 1449   CO2 20 06/22/2014 1630   CO2 20* 02/23/2014 1514   BUN 7 06/22/2014 1630   BUN 9.3 02/23/2014 1514   CREATININE 0.62 06/22/2014 1630   CREATININE 0.7 02/23/2014 1514      Component Value Date/Time   CALCIUM 9.4 06/22/2014 1630   CALCIUM 9.1 02/23/2014 1514   ALKPHOS 134* 06/22/2014 1630   ALKPHOS 43 02/23/2014 1514   AST 18 06/22/2014 1630   AST 14 02/23/2014 1514   ALT 12 06/22/2014 1630   ALT 12 02/23/2014 1514   BILITOT 0.2* 06/22/2014 1630   BILITOT 0.21 02/23/2014 1514       No results found for: LABCA2  No components found for: LABCA125  No results for input(s): INR in the last 168 hours.  Urinalysis No results found for: COLORURINE, APPEARANCEUR, LABSPEC, PHURINE, GLUCOSEU, HGBUR, BILIRUBINUR,  KETONESUR, PROTEINUR, UROBILINOGEN, NITRITE, LEUKOCYTESUR  STUDIES: No results found.   ASSESSMENT: 30 y.o.  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  I do not understand why that is the case then she clearly meets criteria for intensified screening. We're going to order an MRI again later this year and try to get that through her insurance. In the meantime she is due for her routine mammography with tomography in July and I have put that order in as well.  She will turned 30 this year. An CCN guidelines suggest that starting at age 42 patients with BRCA mutations undergo transvaginal ultrasonography every 6 months with CEA 125 measurement. This has not been shown to decrease mortality, but it is the best we can do at this point.  Of course she is considering getting pregnant again this year. In that case we would wait for the ovarian cancer screening to start after she is done with the pregnancy. Note that she was not able to breast-feed with her first child.  After she is completed family planning I think it would be sensible for her to use oral contraceptives for birth control status documented to decrease ovarian cancer risk. We would add tamoxifen to that to decrease breast cancer risk unless she opts for bilateral mastectomies at that time, which I think would be reasonable.  She has a good understanding of this plan. She knows to call for any problems that may develop before her next visit  here.  Chauncey Cruel, MD   05/13/2016 8:56 AM Medical Oncology and Hematology [facility] [address] [place], [place] [address] Tel. [phone]    Fax. [phone]

## 2016-05-13 NOTE — Telephone Encounter (Signed)
appt made and avs printed °

## 2016-05-19 ENCOUNTER — Telehealth: Payer: Self-pay | Admitting: *Deleted

## 2016-05-19 ENCOUNTER — Other Ambulatory Visit: Payer: Self-pay | Admitting: *Deleted

## 2016-05-19 DIAGNOSIS — Z1509 Genetic susceptibility to other malignant neoplasm: Principal | ICD-10-CM

## 2016-05-19 DIAGNOSIS — Z1501 Genetic susceptibility to malignant neoplasm of breast: Secondary | ICD-10-CM

## 2016-05-19 DIAGNOSIS — R922 Inconclusive mammogram: Secondary | ICD-10-CM

## 2016-05-19 DIAGNOSIS — Z800 Family history of malignant neoplasm of digestive organs: Secondary | ICD-10-CM

## 2016-05-19 DIAGNOSIS — Z803 Family history of malignant neoplasm of breast: Secondary | ICD-10-CM

## 2016-05-19 NOTE — Telephone Encounter (Signed)
"  We have an EPIC order put in the wrong way that needs to be put in correctly.  We never do a breast with contrast.  Please enter the order as with and without contrast.  Cannot schedule until order is placed.  Thanks." 

## 2016-06-19 ENCOUNTER — Ambulatory Visit
Admission: RE | Admit: 2016-06-19 | Discharge: 2016-06-19 | Disposition: A | Discharge status: Home / Self Care | Payer: BLUE CROSS/BLUE SHIELD | Source: Ambulatory Visit | Attending: Oncology | Admitting: Oncology

## 2016-06-19 DIAGNOSIS — Z1509 Genetic susceptibility to other malignant neoplasm: Principal | ICD-10-CM

## 2016-06-19 DIAGNOSIS — Z1231 Encounter for screening mammogram for malignant neoplasm of breast: Secondary | ICD-10-CM | POA: Diagnosis not present

## 2016-06-19 DIAGNOSIS — Z1501 Genetic susceptibility to malignant neoplasm of breast: Secondary | ICD-10-CM

## 2016-06-22 DIAGNOSIS — Z3183 Encounter for assisted reproductive fertility procedure cycle: Secondary | ICD-10-CM | POA: Diagnosis not present

## 2016-07-03 ENCOUNTER — Ambulatory Visit: Payer: BLUE CROSS/BLUE SHIELD

## 2016-07-07 DIAGNOSIS — Z3200 Encounter for pregnancy test, result unknown: Secondary | ICD-10-CM | POA: Diagnosis not present

## 2016-07-09 DIAGNOSIS — Z3183 Encounter for assisted reproductive fertility procedure cycle: Secondary | ICD-10-CM | POA: Diagnosis not present

## 2016-07-24 DIAGNOSIS — O0901 Supervision of pregnancy with history of infertility, first trimester: Secondary | ICD-10-CM | POA: Diagnosis not present

## 2016-08-07 DIAGNOSIS — Z3A00 Weeks of gestation of pregnancy not specified: Secondary | ICD-10-CM | POA: Diagnosis not present

## 2016-08-07 DIAGNOSIS — O0901 Supervision of pregnancy with history of infertility, first trimester: Secondary | ICD-10-CM | POA: Diagnosis not present

## 2016-08-21 DIAGNOSIS — Z36 Encounter for antenatal screening of mother: Secondary | ICD-10-CM | POA: Diagnosis not present

## 2016-08-21 DIAGNOSIS — Z3481 Encounter for supervision of other normal pregnancy, first trimester: Secondary | ICD-10-CM | POA: Diagnosis not present

## 2016-08-21 DIAGNOSIS — O0900 Supervision of pregnancy with history of infertility, unspecified trimester: Secondary | ICD-10-CM | POA: Diagnosis not present

## 2016-08-21 DIAGNOSIS — Z3491 Encounter for supervision of normal pregnancy, unspecified, first trimester: Secondary | ICD-10-CM | POA: Diagnosis not present

## 2016-08-21 LAB — OB RESULTS CONSOLE GC/CHLAMYDIA
Chlamydia: NEGATIVE
Gonorrhea: NEGATIVE

## 2016-08-21 LAB — OB RESULTS CONSOLE ABO/RH: RH TYPE: POSITIVE

## 2016-08-21 LAB — OB RESULTS CONSOLE HEPATITIS B SURFACE ANTIGEN: Hepatitis B Surface Ag: NEGATIVE

## 2016-08-21 LAB — OB RESULTS CONSOLE HIV ANTIBODY (ROUTINE TESTING): HIV: NONREACTIVE

## 2016-08-21 LAB — OB RESULTS CONSOLE RPR: RPR: NONREACTIVE

## 2016-08-21 LAB — OB RESULTS CONSOLE RUBELLA ANTIBODY, IGM: Rubella: IMMUNE

## 2016-08-21 LAB — OB RESULTS CONSOLE ANTIBODY SCREEN: ANTIBODY SCREEN: NEGATIVE

## 2016-09-10 DIAGNOSIS — Z3682 Encounter for antenatal screening for nuchal translucency: Secondary | ICD-10-CM | POA: Diagnosis not present

## 2016-09-10 DIAGNOSIS — Z23 Encounter for immunization: Secondary | ICD-10-CM | POA: Diagnosis not present

## 2016-10-06 DIAGNOSIS — O09219 Supervision of pregnancy with history of pre-term labor, unspecified trimester: Secondary | ICD-10-CM | POA: Diagnosis not present

## 2016-10-09 DIAGNOSIS — O0902 Supervision of pregnancy with history of infertility, second trimester: Secondary | ICD-10-CM | POA: Diagnosis not present

## 2016-10-09 DIAGNOSIS — Z3A17 17 weeks gestation of pregnancy: Secondary | ICD-10-CM | POA: Diagnosis not present

## 2016-10-09 DIAGNOSIS — Z361 Encounter for antenatal screening for raised alphafetoprotein level: Secondary | ICD-10-CM | POA: Diagnosis not present

## 2016-10-21 DIAGNOSIS — O0902 Supervision of pregnancy with history of infertility, second trimester: Secondary | ICD-10-CM | POA: Diagnosis not present

## 2016-10-21 DIAGNOSIS — Z3A19 19 weeks gestation of pregnancy: Secondary | ICD-10-CM | POA: Diagnosis not present

## 2016-10-21 DIAGNOSIS — Z363 Encounter for antenatal screening for malformations: Secondary | ICD-10-CM | POA: Diagnosis not present

## 2016-11-04 DIAGNOSIS — Z362 Encounter for other antenatal screening follow-up: Secondary | ICD-10-CM | POA: Diagnosis not present

## 2016-11-04 DIAGNOSIS — O09219 Supervision of pregnancy with history of pre-term labor, unspecified trimester: Secondary | ICD-10-CM | POA: Diagnosis not present

## 2016-11-20 DIAGNOSIS — Z3A23 23 weeks gestation of pregnancy: Secondary | ICD-10-CM | POA: Diagnosis not present

## 2016-11-20 DIAGNOSIS — O0902 Supervision of pregnancy with history of infertility, second trimester: Secondary | ICD-10-CM | POA: Diagnosis not present

## 2016-11-30 NOTE — L&D Delivery Note (Signed)
Delivery Note At 10:08 PM a viable and healthy female was delivered via Vaginal, Spontaneous Delivery (Presentation: LOA  ).  APGAR: 9, 9; weight  poending.   Placenta status: spontaneous, intact.  Cord:  with the following complications: none.  Cord pH: na  Anesthesia:  epidural Episiotomy: None Lacerations: 1st degree Suture Repair: 3.0 vicryl rapide Est. Blood Loss (mL): 150  Mom to postpartum.  Baby to Couplet care / Skin to Skin.  Payal Stanforth J 03/08/2017, 10:25 PM

## 2016-12-01 DIAGNOSIS — O09219 Supervision of pregnancy with history of pre-term labor, unspecified trimester: Secondary | ICD-10-CM | POA: Diagnosis not present

## 2016-12-04 ENCOUNTER — Telehealth: Payer: Self-pay | Admitting: *Deleted

## 2016-12-04 NOTE — Telephone Encounter (Signed)
Pt called to this RN to state she is scheduled for follow up on 1/10 with Dr Jana Hakim -" I am 6 months pregnant and do not know if I need to wait until after I have the baby "  Pt is being followed for high risk/ BRCA +.  Per discussion and pt having no current concerns appointment will be rescheduled to June of 2018 - note pt is due for delivery in April 2018.  In boxrequest sent for appropriate rescheduling per above.

## 2016-12-09 ENCOUNTER — Ambulatory Visit: Payer: BLUE CROSS/BLUE SHIELD | Admitting: Oncology

## 2016-12-09 ENCOUNTER — Other Ambulatory Visit: Payer: BLUE CROSS/BLUE SHIELD

## 2016-12-17 DIAGNOSIS — Z3689 Encounter for other specified antenatal screening: Secondary | ICD-10-CM | POA: Diagnosis not present

## 2017-01-01 DIAGNOSIS — Z3689 Encounter for other specified antenatal screening: Secondary | ICD-10-CM | POA: Diagnosis not present

## 2017-01-01 DIAGNOSIS — Z23 Encounter for immunization: Secondary | ICD-10-CM | POA: Diagnosis not present

## 2017-01-01 DIAGNOSIS — Z3A29 29 weeks gestation of pregnancy: Secondary | ICD-10-CM | POA: Diagnosis not present

## 2017-01-01 DIAGNOSIS — O0903 Supervision of pregnancy with history of infertility, third trimester: Secondary | ICD-10-CM | POA: Diagnosis not present

## 2017-02-18 DIAGNOSIS — Z3A36 36 weeks gestation of pregnancy: Secondary | ICD-10-CM | POA: Diagnosis not present

## 2017-02-18 DIAGNOSIS — O09213 Supervision of pregnancy with history of pre-term labor, third trimester: Secondary | ICD-10-CM | POA: Diagnosis not present

## 2017-02-18 DIAGNOSIS — O0903 Supervision of pregnancy with history of infertility, third trimester: Secondary | ICD-10-CM | POA: Diagnosis not present

## 2017-02-18 DIAGNOSIS — Z3685 Encounter for antenatal screening for Streptococcus B: Secondary | ICD-10-CM | POA: Diagnosis not present

## 2017-02-18 LAB — OB RESULTS CONSOLE GBS: STREP GROUP B AG: POSITIVE

## 2017-02-25 DIAGNOSIS — O3663X0 Maternal care for excessive fetal growth, third trimester, not applicable or unspecified: Secondary | ICD-10-CM | POA: Diagnosis not present

## 2017-02-25 DIAGNOSIS — Z3A37 37 weeks gestation of pregnancy: Secondary | ICD-10-CM | POA: Diagnosis not present

## 2017-03-03 DIAGNOSIS — O3663X0 Maternal care for excessive fetal growth, third trimester, not applicable or unspecified: Secondary | ICD-10-CM | POA: Diagnosis not present

## 2017-03-03 DIAGNOSIS — Z3A38 38 weeks gestation of pregnancy: Secondary | ICD-10-CM | POA: Diagnosis not present

## 2017-03-05 ENCOUNTER — Encounter (HOSPITAL_COMMUNITY): Payer: Self-pay | Admitting: *Deleted

## 2017-03-05 ENCOUNTER — Telehealth (HOSPITAL_COMMUNITY): Payer: Self-pay | Admitting: *Deleted

## 2017-03-05 NOTE — Telephone Encounter (Signed)
Preadmission screen  

## 2017-03-08 ENCOUNTER — Encounter (HOSPITAL_COMMUNITY): Payer: Self-pay

## 2017-03-08 ENCOUNTER — Inpatient Hospital Stay (HOSPITAL_COMMUNITY): Payer: BLUE CROSS/BLUE SHIELD | Admitting: Anesthesiology

## 2017-03-08 ENCOUNTER — Inpatient Hospital Stay (HOSPITAL_COMMUNITY)
Admission: AD | Admit: 2017-03-08 | Discharge: 2017-03-15 | DRG: 775 | Disposition: A | Discharge status: Home / Self Care | Payer: BLUE CROSS/BLUE SHIELD | Source: Ambulatory Visit | Attending: Obstetrics and Gynecology | Admitting: Obstetrics and Gynecology

## 2017-03-08 DIAGNOSIS — O99824 Streptococcus B carrier state complicating childbirth: Secondary | ICD-10-CM | POA: Diagnosis present

## 2017-03-08 DIAGNOSIS — Z3A38 38 weeks gestation of pregnancy: Secondary | ICD-10-CM

## 2017-03-08 DIAGNOSIS — O3663X0 Maternal care for excessive fetal growth, third trimester, not applicable or unspecified: Secondary | ICD-10-CM | POA: Diagnosis not present

## 2017-03-08 DIAGNOSIS — O09213 Supervision of pregnancy with history of pre-term labor, third trimester: Secondary | ICD-10-CM | POA: Diagnosis not present

## 2017-03-08 DIAGNOSIS — Z72820 Sleep deprivation: Secondary | ICD-10-CM | POA: Diagnosis not present

## 2017-03-08 DIAGNOSIS — F419 Anxiety disorder, unspecified: Secondary | ICD-10-CM | POA: Diagnosis not present

## 2017-03-08 DIAGNOSIS — O1494 Unspecified pre-eclampsia, complicating childbirth: Secondary | ICD-10-CM | POA: Diagnosis not present

## 2017-03-08 DIAGNOSIS — O1414 Severe pre-eclampsia complicating childbirth: Principal | ICD-10-CM | POA: Diagnosis present

## 2017-03-08 DIAGNOSIS — O99345 Other mental disorders complicating the puerperium: Secondary | ICD-10-CM | POA: Diagnosis not present

## 2017-03-08 DIAGNOSIS — O9279 Other disorders of lactation: Secondary | ICD-10-CM | POA: Diagnosis present

## 2017-03-08 DIAGNOSIS — O1490 Unspecified pre-eclampsia, unspecified trimester: Secondary | ICD-10-CM | POA: Diagnosis present

## 2017-03-08 DIAGNOSIS — O99344 Other mental disorders complicating childbirth: Secondary | ICD-10-CM | POA: Diagnosis not present

## 2017-03-08 DIAGNOSIS — O1424 HELLP syndrome, complicating childbirth: Secondary | ICD-10-CM | POA: Diagnosis not present

## 2017-03-08 DIAGNOSIS — O134 Gestational [pregnancy-induced] hypertension without significant proteinuria, complicating childbirth: Secondary | ICD-10-CM | POA: Diagnosis not present

## 2017-03-08 LAB — CBC
HCT: 37 % (ref 36.0–46.0)
HCT: 36.2 % (ref 36.0–46.0)
HEMOGLOBIN: 12.6 g/dL (ref 12.0–15.0)
HEMOGLOBIN: 12.2 g/dL (ref 12.0–15.0)
MCH: 29.5 pg (ref 26.0–34.0)
MCH: 29.4 pg (ref 26.0–34.0)
MCHC: 34.1 g/dL (ref 30.0–36.0)
MCHC: 33.7 g/dL (ref 30.0–36.0)
MCV: 86.7 fL (ref 78.0–100.0)
MCV: 87.2 fL (ref 78.0–100.0)
PLATELETS: 154 10*3/uL (ref 150–400)
PLATELETS: 144 10*3/uL — AB (ref 150–400)
RBC: 4.27 MIL/uL (ref 3.87–5.11)
RBC: 4.15 MIL/uL (ref 3.87–5.11)
RDW: 13.4 % (ref 11.5–15.5)
RDW: 13.6 % (ref 11.5–15.5)
WBC: 13.2 10*3/uL — ABNORMAL HIGH (ref 4.0–10.5)
WBC: 13.7 10*3/uL — AB (ref 4.0–10.5)

## 2017-03-08 LAB — PROTEIN / CREATININE RATIO, URINE
CREATININE, URINE: 46 mg/dL
Protein Creatinine Ratio: 0.76 mg/mg{Cre} — ABNORMAL HIGH (ref 0.00–0.15)
TOTAL PROTEIN, URINE: 35 mg/dL

## 2017-03-08 LAB — COMPREHENSIVE METABOLIC PANEL
ALBUMIN: 2.9 g/dL — AB (ref 3.5–5.0)
ALT: 18 U/L (ref 14–54)
AST: 26 U/L (ref 15–41)
Alkaline Phosphatase: 197 U/L — ABNORMAL HIGH (ref 38–126)
Anion gap: 11 (ref 5–15)
BILIRUBIN TOTAL: 0.6 mg/dL (ref 0.3–1.2)
BUN: 13 mg/dL (ref 6–20)
CALCIUM: 9 mg/dL (ref 8.9–10.3)
CO2: 19 mmol/L — ABNORMAL LOW (ref 22–32)
Chloride: 105 mmol/L (ref 101–111)
Creatinine, Ser: 0.7 mg/dL (ref 0.44–1.00)
GFR calc Af Amer: >60 mL/min (ref >60)
GLUCOSE: 71 mg/dL (ref 65–99)
POTASSIUM: 4.6 mmol/L (ref 3.5–5.1)
Sodium: 135 mmol/L (ref 135–145)
TOTAL PROTEIN: 6.2 g/dL — AB (ref 6.5–8.1)

## 2017-03-08 LAB — TYPE AND SCREEN
ABO/RH(D): A POS
ANTIBODY SCREEN: NEGATIVE

## 2017-03-08 MED ORDER — LACTATED RINGERS IV SOLN
INTRAVENOUS | Status: DC
  Administered 2017-03-08: 16:00:00 via INTRAVENOUS

## 2017-03-08 MED ORDER — HYDRALAZINE HCL 20 MG/ML IJ SOLN: 10.0000 mg | Freq: Once | INTRAMUSCULAR | Status: DC | PRN

## 2017-03-08 MED ORDER — MAGNESIUM SULFATE BOLUS VIA INFUSION
4.0000 g | Freq: Once | INTRAVENOUS | Status: AC
  Administered 2017-03-08: 4 g via INTRAVENOUS
  Filled 2017-03-08: qty 500

## 2017-03-08 MED ORDER — OXYCODONE-ACETAMINOPHEN 5-325 MG PO TABS: 2.0000 | ORAL_TABLET | ORAL | Status: DC | PRN

## 2017-03-08 MED ORDER — SOD CITRATE-CITRIC ACID 500-334 MG/5ML PO SOLN
30.0000 mL | ORAL | Status: DC | PRN
Start: 2017-03-08 — End: 2017-03-09

## 2017-03-08 MED ORDER — LIDOCAINE HCL (PF) 1 % IJ SOLN
INTRAMUSCULAR | Status: DC | PRN
  Administered 2017-03-08: 5 mL
  Administered 2017-03-08: 3 mL
  Administered 2017-03-08: 2 mL

## 2017-03-08 MED ORDER — OXYTOCIN 40 UNITS IN LACTATED RINGERS INFUSION - SIMPLE MED
1.0000 m[IU]/min | INTRAVENOUS | Status: DC
  Administered 2017-03-08: 2 m[IU]/min via INTRAVENOUS
  Filled 2017-03-08: qty 1000

## 2017-03-08 MED ORDER — LACTATED RINGERS IV SOLN: 500.0000 mL | Freq: Once | INTRAVENOUS | Status: DC

## 2017-03-08 MED ORDER — OXYCODONE-ACETAMINOPHEN 5-325 MG PO TABS: 1.0000 | ORAL_TABLET | ORAL | Status: DC | PRN

## 2017-03-08 MED ORDER — LABETALOL HCL 5 MG/ML IV SOLN
20.0000 mg | INTRAVENOUS | Status: DC | PRN
  Administered 2017-03-08: 20 mg via INTRAVENOUS
  Filled 2017-03-08: qty 4
  Filled 2017-03-08: qty 8

## 2017-03-08 MED ORDER — ONDANSETRON HCL 4 MG/2ML IJ SOLN: 4.0000 mg | Freq: Four times a day (QID) | INTRAMUSCULAR | Status: DC | PRN

## 2017-03-08 MED ORDER — MAGNESIUM SULFATE 40 G IN LACTATED RINGERS - SIMPLE
2.0000 g/h | INTRAVENOUS | Status: DC
  Filled 2017-03-08: qty 500

## 2017-03-08 MED ORDER — EPHEDRINE 5 MG/ML INJ
10.0000 mg | INTRAVENOUS | Status: DC | PRN
  Filled 2017-03-08: qty 2

## 2017-03-08 MED ORDER — TERBUTALINE SULFATE 1 MG/ML IJ SOLN: 0.2500 mg | Freq: Once | INTRAMUSCULAR | Status: DC | PRN

## 2017-03-08 MED ORDER — OXYTOCIN BOLUS FROM INFUSION
500.0000 mL | Freq: Once | INTRAVENOUS | Status: AC
Start: 2017-03-08 — End: 2017-03-08
  Administered 2017-03-08: 500 mL via INTRAVENOUS

## 2017-03-08 MED ORDER — DIPHENHYDRAMINE HCL 50 MG/ML IJ SOLN: 12.5000 mg | INTRAMUSCULAR | Status: DC | PRN

## 2017-03-08 MED ORDER — FENTANYL 2.5 MCG/ML BUPIVACAINE 1/10 % EPIDURAL INFUSION (WH - ANES)
14.0000 mL/h | INTRAMUSCULAR | Status: DC | PRN
  Administered 2017-03-08: 14 mL/h via EPIDURAL
  Filled 2017-03-08: qty 100

## 2017-03-08 MED ORDER — LIDOCAINE HCL (PF) 1 % IJ SOLN
30.0000 mL | INTRAMUSCULAR | Status: DC | PRN
  Filled 2017-03-08: qty 30

## 2017-03-08 MED ORDER — AMPICILLIN SODIUM 2 G IJ SOLR
2.0000 g | Freq: Four times a day (QID) | INTRAMUSCULAR | Status: DC
  Administered 2017-03-08: 2 g via INTRAVENOUS
  Filled 2017-03-08 (×4): qty 2000

## 2017-03-08 MED ORDER — LACTATED RINGERS IV SOLN
500.0000 mL | Freq: Once | INTRAVENOUS | Status: AC
  Administered 2017-03-08: 500 mL via INTRAVENOUS

## 2017-03-08 MED ORDER — LACTATED RINGERS IV SOLN: 500.0000 mL | INTRAVENOUS | Status: DC | PRN

## 2017-03-08 MED ORDER — PHENYLEPHRINE 40 MCG/ML (10ML) SYRINGE FOR IV PUSH (FOR BLOOD PRESSURE SUPPORT)
80.0000 ug | PREFILLED_SYRINGE | INTRAVENOUS | Status: DC | PRN
  Filled 2017-03-08 (×2): qty 10
  Filled 2017-03-08: qty 5

## 2017-03-08 MED ORDER — PHENYLEPHRINE 40 MCG/ML (10ML) SYRINGE FOR IV PUSH (FOR BLOOD PRESSURE SUPPORT)
80.0000 ug | PREFILLED_SYRINGE | INTRAVENOUS | Status: DC | PRN
  Filled 2017-03-08: qty 5

## 2017-03-08 MED ORDER — PHENYLEPHRINE 40 MCG/ML (10ML) SYRINGE FOR IV PUSH (FOR BLOOD PRESSURE SUPPORT): 80.0000 ug | PREFILLED_SYRINGE | INTRAVENOUS | Status: DC | PRN

## 2017-03-08 MED ORDER — OXYTOCIN 40 UNITS IN LACTATED RINGERS INFUSION - SIMPLE MED: 2.5000 [IU]/h | INTRAVENOUS | Status: DC

## 2017-03-08 MED ORDER — ACETAMINOPHEN 325 MG PO TABS
650.0000 mg | ORAL_TABLET | ORAL | Status: DC | PRN
  Administered 2017-03-09: 650 mg via ORAL
  Filled 2017-03-08: qty 2

## 2017-03-08 MED ORDER — EPHEDRINE 5 MG/ML INJ: 10.0000 mg | INTRAVENOUS | Status: DC | PRN

## 2017-03-08 NOTE — H&P (Signed)
Leah Davis is a 31 y.o. female presenting for IOL. Presented to office with severe hypertension. No HA or epigastric pain.  OB History    Gravida Para Term Preterm AB Living   _0 SAB TAB Ectopic Multiple Live Births           1     Past Medical History:  Diagnosis Date  . Anxiety   . BRCA1 positive 2014  . Condyloma acuminata 05/2012   see 06/17/2012 note.  . Migraine    WITHOUT AURA  . PMS (premenstrual syndrome)   . Postpartum care following vaginal delivery (7/25) 06/23/2014  . Pregnancy 02/24/2014   Past Surgical History:  Procedure Laterality Date  . ivf    . WISDOM TOOTH EXTRACTION     Family History: family history includes Breast cancer in her maternal grandmother; Breast cancer (age of onset: 62) in her mother; Cancer in her paternal grandfather; Rheum arthritis in her father. Social History:  reports that she has never smoked. She has never used smokeless tobacco. She reports that she drinks alcohol. She reports that she does not use drugs.     Maternal Diabetes: No Genetic Screening: Normal Maternal Ultrasounds/Referrals: Normal Fetal Ultrasounds or other Referrals:  None Maternal Substance Abuse:  No Significant Maternal Medications:  None Significant Maternal Lab Results:  Lab values include: Group B Strep positive Other Comments:  None  Review of Systems  Constitutional: Negative.   All other systems reviewed and are negative.  Maternal Medical History:  Contractions: Frequency: rare.    Fetal activity: Perceived fetal activity is normal.   Last perceived fetal movement was within the past hour.    Prenatal complications: PIH.   Prenatal Complications - Diabetes: none.      unknown if currently breastfeeding. Maternal Exam:  Uterine Assessment: Contraction strength is mild.  Contraction frequency is rare.   Abdomen: Patient reports no abdominal tenderness. Fetal presentation: vertex  Introitus: Normal vulva. Normal vagina.   Ferning test: not done.  Nitrazine test: not done. Amniotic fluid character: not assessed.  Pelvis: adequate for delivery.   Cervix: Cervix evaluated by digital exam.     Physical Exam  Nursing note and vitals reviewed. Constitutional: She is oriented to person, place, and time. She appears well-developed and well-nourished.  HENT:  Head: Normocephalic and atraumatic.  Neck: Normal range of motion. Neck supple.  Cardiovascular: Normal rate and regular rhythm.   Respiratory: Effort normal and breath sounds normal.  GI: Soft. Bowel sounds are normal.  Genitourinary: Vagina normal and uterus normal.  Musculoskeletal: Normal range of motion.  Neurological: She is alert and oriented to person, place, and time. She has normal reflexes.  Skin: Skin is warm and dry.  Psychiatric: She has a normal mood and affect.    Prenatal labs: ABO, Rh: A/Positive/-- (09/22 0000) Antibody: Negative (09/22 0000) Rubella: Immune (09/22 0000) RPR: Nonreactive (09/22 0000)  HBsAg: Negative (09/22 0000)  HIV: Non-reactive (09/22 0000)  GBS: Positive (03/22 0000)   Assessment/Plan: 38 wk IUP Severe HTN - likely PEC by criteria GBS pos Pitocin, abx, Mag SO4 and anti HTN prn   Exa Bomba J 03/08/2017, 3:20 PM

## 2017-03-08 NOTE — Anesthesia Procedure Notes (Signed)
Epidural Patient location during procedure: OB Start time: 03/08/2017 4:45 PM End time: 03/08/2017 4:58 PM  Staffing Anesthesiologist: Marcene Duos Performed: anesthesiologist   Preanesthetic Checklist Completed: patient identified, site marked, surgical consent, pre-op evaluation, timeout performed, IV checked, risks and benefits discussed and monitors and equipment checked  Epidural Patient position: sitting Prep: site prepped and draped and DuraPrep Patient monitoring: continuous pulse ox and blood pressure Approach: midline Location: L4-L5 Injection technique: LOR air  Needle:  Needle type: Tuohy  Needle gauge: 17 G Needle length: 9 cm and 9 Needle insertion depth: 6 cm Catheter type: closed end flexible Catheter size: 19 Gauge Catheter at skin depth: 13 (11-->13cm when laid in lat decub position.) cm Test dose: negative  Assessment Events: blood not aspirated, injection not painful, no injection resistance, negative IV test and no paresthesia

## 2017-03-08 NOTE — Anesthesia Preprocedure Evaluation (Addendum)
Anesthesia Evaluation  Patient identified by MRN, date of birth, ID band Patient awake    Reviewed: Allergy & Precautions, Patient's Chart, lab work & pertinent test results  Airway Mallampati: II  TM Distance: >3 FB     Dental   Pulmonary neg pulmonary ROS,    Pulmonary exam normal        Cardiovascular hypertension (pre-ecclampsia), Normal cardiovascular exam     Neuro/Psych  Headaches, Anxiety Depression    GI/Hepatic negative GI ROS, Neg liver ROS,   Endo/Other  negative endocrine ROS  Renal/GU negative Renal ROS     Musculoskeletal   Abdominal   Peds  Hematology negative hematology ROS (+)   Anesthesia Other Findings   Reproductive/Obstetrics (+) Pregnancy (G2 for IOL 2/2 pre-E)                            Lab Results  Component Value Date   WBC 13.2 (H) 03/08/2017   HGB 12.6 03/08/2017   HCT 37.0 03/08/2017   MCV 86.7 03/08/2017   PLT 154 03/08/2017    Anesthesia Physical Anesthesia Plan  ASA: III  Anesthesia Plan: Epidural   Post-op Pain Management:    Induction:   Airway Management Planned: Natural Airway  Additional Equipment:   Intra-op Plan:   Post-operative Plan:   Informed Consent: I have reviewed the patients History and Physical, chart, labs and discussed the procedure including the risks, benefits and alternatives for the proposed anesthesia with the patient or authorized representative who has indicated his/her understanding and acceptance.     Plan Discussed with:   Anesthesia Plan Comments:         Anesthesia Quick Evaluation

## 2017-03-08 NOTE — Progress Notes (Signed)
Leah Davis is a 31 y.o. G2P0101 at [redacted]w[redacted]d by LMP admitted for induction of labor due to Pre-eclamptic toxemia of pregnancy..  Subjective: Comfortable No headache or epigastric pain  Objective: BP (!) 166/100   Pulse 84   Temp 98 F (36.7 C) (Oral)   Resp 16   Ht  (1.702 m)   Wt 90.7 kg (200 lb)   SpO2 97%   BMI 31.32 kg/m  I/O last 3 completed shifts: In: 335 [P.O.:222; I.V.:113] Out: 125 [Urine:125] No intake/output data recorded.  FHT:  FHR: 145 bpm, variability: moderate,  accelerations:  Present,  decelerations:  Absent UC:   regular, every 3 minutes SVE:   Dilation: 5 Effacement (%): 80 Station: -1 Exam by:: Dr. Billy Davis  MVU 215 ROT asynclitic  Labs: Lab Results  Component Value Date   WBC 13.2 (H) 03/08/2017   HGB 12.6 03/08/2017   HCT 37.0 03/08/2017   MCV 86.7 03/08/2017   PLT 154 03/08/2017    Assessment / Plan: Induction of labor due to severe PEC,  progressing well on pitocin  Labor: Progressing normally Preeclampsia:  on magnesium sulfate, no signs or symptoms of toxicity, intake and ouput balanced and labs stable Fetal Wellbeing:  Category I Pain Control:  Epidural I/D:  n/a Anticipated MOD:  NSVD  Leah Davis J 03/08/2017, 9:05 PM

## 2017-03-09 ENCOUNTER — Encounter (HOSPITAL_COMMUNITY): Payer: Self-pay

## 2017-03-09 LAB — COMPREHENSIVE METABOLIC PANEL
ALBUMIN: 2.4 g/dL — AB (ref 3.5–5.0)
ALK PHOS: 162 U/L — AB (ref 38–126)
ALT: 15 U/L (ref 14–54)
ANION GAP: 7 (ref 5–15)
AST: 23 U/L (ref 15–41)
BILIRUBIN TOTAL: 0.3 mg/dL (ref 0.3–1.2)
BUN: 9 mg/dL (ref 6–20)
CALCIUM: 7.6 mg/dL — AB (ref 8.9–10.3)
CO2: 21 mmol/L — ABNORMAL LOW (ref 22–32)
Chloride: 105 mmol/L (ref 101–111)
Creatinine, Ser: 0.66 mg/dL (ref 0.44–1.00)
Glucose, Bld: 92 mg/dL (ref 65–99)
POTASSIUM: 4 mmol/L (ref 3.5–5.1)
Sodium: 133 mmol/L — ABNORMAL LOW (ref 135–145)
TOTAL PROTEIN: 5.1 g/dL — AB (ref 6.5–8.1)

## 2017-03-09 LAB — CBC
HEMATOCRIT: 36 % (ref 36.0–46.0)
Hemoglobin: 12.1 g/dL (ref 12.0–15.0)
MCH: 29.2 pg (ref 26.0–34.0)
MCHC: 33.6 g/dL (ref 30.0–36.0)
MCV: 87 fL (ref 78.0–100.0)
Platelets: 152 10*3/uL (ref 150–400)
RBC: 4.14 MIL/uL (ref 3.87–5.11)
RDW: 13.6 % (ref 11.5–15.5)
WBC: 14.7 10*3/uL — ABNORMAL HIGH (ref 4.0–10.5)

## 2017-03-09 LAB — RPR: RPR Ser Ql: NONREACTIVE

## 2017-03-09 MED ORDER — PRENATAL MULTIVITAMIN CH
1.0000 | ORAL_TABLET | Freq: Every day | ORAL | Status: DC
  Administered 2017-03-09 – 2017-03-15 (×7): 1 via ORAL
  Filled 2017-03-09 (×7): qty 1

## 2017-03-09 MED ORDER — ONDANSETRON HCL 4 MG PO TABS: 4.0000 mg | ORAL_TABLET | ORAL | Status: DC | PRN

## 2017-03-09 MED ORDER — LACTATED RINGERS IV SOLN
INTRAVENOUS | Status: DC
  Administered 2017-03-09 (×2): via INTRAVENOUS

## 2017-03-09 MED ORDER — DIPHENHYDRAMINE HCL 25 MG PO CAPS: 25.0000 mg | ORAL_CAPSULE | Freq: Four times a day (QID) | ORAL | Status: DC | PRN

## 2017-03-09 MED ORDER — LABETALOL HCL 200 MG PO TABS
200.0000 mg | ORAL_TABLET | Freq: Three times a day (TID) | ORAL | Status: DC
  Administered 2017-03-09 – 2017-03-12 (×8): 200 mg via ORAL
  Filled 2017-03-09 (×8): qty 1

## 2017-03-09 MED ORDER — BENZOCAINE-MENTHOL 20-0.5 % EX AERO
1.0000 "application " | INHALATION_SPRAY | CUTANEOUS | Status: DC | PRN
  Administered 2017-03-09: 1 via TOPICAL
  Filled 2017-03-09: qty 56

## 2017-03-09 MED ORDER — MAGNESIUM SULFATE 40 G IN LACTATED RINGERS - SIMPLE
2.0000 g/h | INTRAVENOUS | Status: DC
  Filled 2017-03-09: qty 500

## 2017-03-09 MED ORDER — WITCH HAZEL-GLYCERIN EX PADS
1.0000 | MEDICATED_PAD | CUTANEOUS | Status: DC | PRN
Start: 2017-03-09 — End: 2017-03-15

## 2017-03-09 MED ORDER — MAGNESIUM SULFATE 40 G IN LACTATED RINGERS - SIMPLE
2.0000 g/h | INTRAVENOUS | Status: DC
  Administered 2017-03-09: 2 g/h via INTRAVENOUS
  Filled 2017-03-09: qty 500

## 2017-03-09 MED ORDER — TETANUS-DIPHTH-ACELL PERTUSSIS 5-2.5-18.5 LF-MCG/0.5 IM SUSP: 0.5000 mL | Freq: Once | INTRAMUSCULAR | Status: DC

## 2017-03-09 MED ORDER — IBUPROFEN 600 MG PO TABS
600.0000 mg | ORAL_TABLET | Freq: Four times a day (QID) | ORAL | Status: DC
  Administered 2017-03-09 – 2017-03-12 (×11): 600 mg via ORAL
  Filled 2017-03-09 (×11): qty 1

## 2017-03-09 MED ORDER — ONDANSETRON HCL 4 MG/2ML IJ SOLN: 4.0000 mg | INTRAMUSCULAR | Status: DC | PRN

## 2017-03-09 MED ORDER — COCONUT OIL OIL
1.0000 "application " | TOPICAL_OIL | Status: DC | PRN
  Filled 2017-03-09: qty 120

## 2017-03-09 MED ORDER — ZOLPIDEM TARTRATE 5 MG PO TABS: 5.0000 mg | ORAL_TABLET | Freq: Every evening | ORAL | Status: DC | PRN

## 2017-03-09 MED ORDER — OXYCODONE-ACETAMINOPHEN 5-325 MG PO TABS: 2.0000 | ORAL_TABLET | ORAL | Status: DC | PRN

## 2017-03-09 MED ORDER — SIMETHICONE 80 MG PO CHEW: 80.0000 mg | CHEWABLE_TABLET | ORAL | Status: DC | PRN

## 2017-03-09 MED ORDER — LABETALOL HCL 200 MG PO TABS: 200.0000 mg | ORAL_TABLET | Freq: Three times a day (TID) | ORAL | Status: DC

## 2017-03-09 MED ORDER — LABETALOL HCL 100 MG PO TABS
100.0000 mg | ORAL_TABLET | Freq: Three times a day (TID) | ORAL | Status: DC
  Administered 2017-03-09 (×3): 100 mg via ORAL
  Filled 2017-03-09 (×3): qty 1

## 2017-03-09 MED ORDER — METHYLERGONOVINE MALEATE 0.2 MG/ML IJ SOLN: 0.2000 mg | INTRAMUSCULAR | Status: DC | PRN

## 2017-03-09 MED ORDER — DIBUCAINE 1 % RE OINT: 1.0000 "application " | TOPICAL_OINTMENT | RECTAL | Status: DC | PRN

## 2017-03-09 MED ORDER — ACETAMINOPHEN 325 MG PO TABS
650.0000 mg | ORAL_TABLET | ORAL | Status: DC | PRN
  Administered 2017-03-09 – 2017-03-14 (×9): 650 mg via ORAL
  Filled 2017-03-09 (×9): qty 2

## 2017-03-09 MED ORDER — HYDRALAZINE HCL 20 MG/ML IJ SOLN
10.0000 mg | Freq: Once | INTRAMUSCULAR | Status: AC | PRN
  Administered 2017-03-11: 10 mg via INTRAVENOUS
  Filled 2017-03-09 (×2): qty 1

## 2017-03-09 MED ORDER — SENNOSIDES-DOCUSATE SODIUM 8.6-50 MG PO TABS
2.0000 | ORAL_TABLET | ORAL | Status: DC
  Administered 2017-03-10 – 2017-03-15 (×5): 2 via ORAL
  Filled 2017-03-09 (×7): qty 2

## 2017-03-09 MED ORDER — LABETALOL HCL 5 MG/ML IV SOLN
20.0000 mg | INTRAVENOUS | Status: AC | PRN
  Administered 2017-03-09: 20 mg via INTRAVENOUS
  Administered 2017-03-11: 40 mg via INTRAVENOUS
  Administered 2017-03-11: 20 mg via INTRAVENOUS
  Filled 2017-03-09 (×2): qty 4

## 2017-03-09 MED ORDER — OXYCODONE-ACETAMINOPHEN 5-325 MG PO TABS: 1.0000 | ORAL_TABLET | ORAL | Status: DC | PRN

## 2017-03-09 MED ORDER — METHYLERGONOVINE MALEATE 0.2 MG PO TABS: 0.2000 mg | ORAL_TABLET | ORAL | Status: DC | PRN

## 2017-03-09 NOTE — Lactation Note (Signed)
This note was copied from a baby's chart. Lactation Consultation Note  Patient Name: Leah Davis LOVFI'E Date: 03/09/2017 Reason for consult: Initial assessment   With this mom of a term baby, now 43 hours old. This is mom's second baby, and she reports she was not able to breast feed her first baby, due to LMS.  Mom has evert, round nipples, which make it easy for baby to just nipple suck, instead of breast feed. I assisted mom with cross cradle hold, and it took 2 attempts to get [facility] beyond mom's nipple. Mom could feed the difference between the shallow, which caused nipple pinching, and the deeper, which caused breast movement.  Mom on magnesium drip, and having trouble focusing. She will need teaching reinforced. Her Magnesium drip with end at 2200 tonight.  Basic breast feeding and lactation teaching done with mom. She knows to call for questions/concerns.   Maternal Data Formula Feeding for Exclusion: No Has patient been taught Hand Expression?: Yes Does the patient have breastfeeding experience prior to this delivery?: No  Feeding Feeding Type: Breast Fed (mom 's first was a LPI, and mom reports LMS with first) Length of feed: 12 min  LATCH Score/Interventions Latch: Repeated attempts needed to sustain latch, nipple held in mouth throughout feeding, stimulation needed to elicit sucking reflex. Intervention(s): Adjust position;Assist with latch  Audible Swallowing: A few with stimulation (easily expressed colostrum) Intervention(s): Skin to skin;Hand expression  Type of Nipple: Everted at rest and after stimulation  Comfort (Breast/Nipple): Soft / non-tender     Hold (Positioning): Assistance needed to correctly position infant at breast and maintain latch. Intervention(s): Breastfeeding basics reviewed;Support Pillows;Skin to skin  LATCH Score: 7  Lactation Tools Discussed/Used     Consult Status Consult Status: Follow-up Date: 03/10/17 Follow-up type:  In-patient    Leah Davis 03/09/2017, 10:14 AM

## 2017-03-09 NOTE — Final Progress Note (Signed)
BP 171/95. Dr. Eather Colas of elevated Bp. Orders were received and carried. Pt was given BP medications. Will continue to monitor.

## 2017-03-09 NOTE — Anesthesia Postprocedure Evaluation (Signed)
Anesthesia Post Note  Patient: Leah Davis  Procedure(s) Performed: * No procedures listed *  Patient location during evaluation: [facility] Anesthesia Type: Epidural Level of consciousness: awake and alert Pain management: pain level controlled Vital Signs Assessment: post-procedure vital signs reviewed and stable Respiratory status: spontaneous breathing, nonlabored ventilation and respiratory function stable Cardiovascular status: stable Postop Assessment: no headache, no backache and epidural receding Anesthetic complications: no        Last Vitals:  Vitals:   03/09/17 0600 03/09/17 0700  BP:    Pulse:    Resp: 16 16  Temp:      Last Pain:  Vitals:   03/09/17 0505  TempSrc:   PainSc: 1    Pain Goal: Patients Stated Pain Goal: 2 (03/09/17 0030)               Junious Silk

## 2017-03-09 NOTE — Progress Notes (Addendum)
PPD #1, SVD, IOL for pre-eclampsia, baby girl   S:  Reports feeling very tired, she reports a mild headache with some relief with Motrin.  She denies visual disturbances and epigastric pain.              Tolerating po/ No nausea or vomiting             Bleeding is light             Pain controlled with Motrin             Up ad lib / ambulatory / voiding QS  Newborn breast feeding - going well  O:               VS: BP (!) 171/95 (BP Location: Left Arm)   Pulse 79   Temp 97.6 F (36.4 C) (Oral)   Resp 16   Ht  (1.702 m)   Wt 200 lb (90.7 kg)   SpO2 99%   Breastfeeding? Unknown   BMI 31.32 kg/m    LABS:              Recent Labs  03/08/17 2243 03/09/17 0724  WBC 13.7* 14.7*  HGB 12.2 12.1  PLT 144* 152               Blood type: --/--/A POS (04/09 1536)  Rubella: Immune (09/22 0000)                     I&O: Intake/Output      04/09 0701 - 04/10 0700 04/10 0701 - 04/11 0700   P.O. 642    I.V. (mL/kg) 925.5 (10.2)    IV Piggyback 0    Total Intake(mL/kg) 1567.5 (17.3)    Urine (mL/kg/hr) 1025    Blood 150    Total Output 1175     Net +392.5                        Physical Exam:             Alert and oriented X3  Lungs: Clear and unlabored  Heart: regular rate and rhythm / no mumurs  Abdomen: mildly firm, non-tender, non-distended              Fundus: firm, non-tender, U-E  Perineum: well approximated 1st degree laceration, healing well, no significant edema, no significant erythema  Lochia: appropriate, no clots  Extremities: mild +1 BLE edema, no calf pain or tenderness, +3 brisk DTRs, 2 beats of clonus    A: PPD # 1, s/p NSVD, baby girl   IOL for pre-eclampsia  Doing well - stable status  GBS Positive, adequately treated  P: Routine post partum orders  Magnesium Sulfate for 24 hours - due to be turned off at 10:08pm tonight only if she is diuresing   Continue monitoring strict I & Os  Pre-eclampsia protocol ordered - still having severe range BPs,  give Labetalol  IV now, with repeat BP 139/91  Continue close monitoring of BPs  See Lactation today  I consulted/ collaborated with Dr. Billy Coast for plan of care  Carlean Jews, [facility]  [facility]

## 2017-03-09 NOTE — Progress Notes (Signed)
MOB was referred for history of depression/anxiety. * Referral screened out by Clinical Social Worker because none of the following criteria appear to apply: ~ History of anxiety/depression during this pregnancy, or of post-partum depression. ~ Diagnosis of anxiety and/or depression within last 3 years OR * MOB's symptoms currently being treated with medication and/or therapy. Please contact the Clinical Social Worker if needs arise, or if MOB requests.   

## 2017-03-09 NOTE — Progress Notes (Signed)
Late entry interval note: BP still in 140s/90s.  Increased Labetalol to  TID.  Has not started adequately diuresing, continue Magnesium sulfate until morning.  RN notified of orders.   Carlean Jews, CNM

## 2017-03-10 ENCOUNTER — Inpatient Hospital Stay (HOSPITAL_COMMUNITY): Admission: RE | Admit: 2017-03-10 | Payer: BLUE CROSS/BLUE SHIELD | Source: Ambulatory Visit

## 2017-03-10 ENCOUNTER — Encounter (HOSPITAL_COMMUNITY): Payer: Self-pay

## 2017-03-10 MED ORDER — HYDROCHLOROTHIAZIDE 25 MG PO TABS
25.0000 mg | ORAL_TABLET | Freq: Every day | ORAL | Status: DC
  Administered 2017-03-10 – 2017-03-11 (×2): 25 mg via ORAL
  Filled 2017-03-10 (×2): qty 1

## 2017-03-10 MED ORDER — HYDROCHLOROTHIAZIDE 12.5 MG PO CAPS: 25.0000 mg | ORAL_CAPSULE | ORAL | Status: DC

## 2017-03-10 NOTE — Lactation Note (Signed)
This note was copied from a baby's chart. Lactation Consultation Note  Patient Name: Girl Alexiana Laverdure EXBMW'U Date: 03/10/2017  Mom states baby is going to breast frequently and well.  Discussed basics and answered questions.  Encouraged to continue feeding with cues and call for assist/concerns.   Maternal Data    Feeding    LATCH Score/Interventions                      Lactation Tools Discussed/Used     Consult Status      Huston Foley 03/10/2017, 1:49 PM

## 2017-03-10 NOTE — Progress Notes (Signed)
Patient ID: Leah Davis, female   DOB: [birth year], 31 y.o.   MRN: [id number] Asymptomatic BP (!) 155/84 (BP Location: Left Arm)   Pulse 75   Temp 97.5 F (36.4 C) (Oral)   Resp 18   Ht  (1.702 m)   Wt 90.7 kg (200 lb)   SpO2 99%   Breastfeeding? Unknown   BMI 31.32 kg/m   UO excellent DC MgS04

## 2017-03-10 NOTE — Progress Notes (Signed)
PPD 2 SVD /severe preeclampsia (s/p magnesium x 6hours)  S:  Reports feeling better             Tolerating po/ No nausea or vomiting             Bleeding is light             Up ad lib / ambulatory / voiding QS  O:       VS: BP (!) 152/96 (BP Location: Left Arm)   Pulse 81   Temp 97.4 F (36.3 C) (Oral)   Resp 18   Ht  (1.702 m)   Wt 212 lb 12 oz (96.5 kg)   SpO2 97%   Breastfeeding? Unknown   BMI 33.32 kg/m    BP 152/96 -151/88 - 155/84   LABS:              Recent Labs  03/08/17 2243 03/09/17 0724  WBC 13.7* 14.7*  HGB 12.2 12.1  PLT 144* 152               Blood type: --/--/A POS (04/09 1536)  Rubella: Immune (09/22 0000)                     I&O: Intake/Output      04/10 0701 - 04/11 0700 04/11 0701 - 04/12 0700   P.O. 2700    I.V. (mL/kg) 1887.5 (19.6)    IV Piggyback     Total Intake(mL/kg) 4587.5 (47.5)    Urine (mL/kg/hr) 5400 (2.3) 1450 (2.3)   Blood     Total Output 5400 1450   Net -812.5 -1450                    Physical Exam:             Alert and oriented X3  Abdomen: soft, non-tender, non-distended              Fundus: firm, non-tender, Ueven  Extremities: 2+edema, no calf pain or tenderness    A: PPD # 2             Severe preeclampsia - magnesium off this am   Doing well - stable status  P: Routine post partum orders  HCTZ  dose this am             Continue to monitor I&O / BP s/p DC magnesium             Anticipate DC tomorrow  Marlinda Mike CNM, MSN, FACNM 03/10/2017, 1:34 PM

## 2017-03-11 LAB — CBC
HCT: 36.9 % (ref 36.0–46.0)
Hemoglobin: 12.2 g/dL (ref 12.0–15.0)
MCH: 29.3 pg (ref 26.0–34.0)
MCHC: 33.1 g/dL (ref 30.0–36.0)
MCV: 88.5 fL (ref 78.0–100.0)
Platelets: 156 10*3/uL (ref 150–400)
RBC: 4.17 MIL/uL (ref 3.87–5.11)
RDW: 14.1 % (ref 11.5–15.5)
WBC: 13.6 10*3/uL — ABNORMAL HIGH (ref 4.0–10.5)

## 2017-03-11 LAB — COMPREHENSIVE METABOLIC PANEL
ALT: 19 U/L (ref 14–54)
AST: 29 U/L (ref 15–41)
Albumin: 2.6 g/dL — ABNORMAL LOW (ref 3.5–5.0)
Alkaline Phosphatase: 137 U/L — ABNORMAL HIGH (ref 38–126)
Anion gap: 7 (ref 5–15)
BUN: 13 mg/dL (ref 6–20)
CO2: 20 mmol/L — ABNORMAL LOW (ref 22–32)
Calcium: 8.4 mg/dL — ABNORMAL LOW (ref 8.9–10.3)
Chloride: 105 mmol/L (ref 101–111)
Creatinine, Ser: 0.67 mg/dL (ref 0.44–1.00)
GFR calc Af Amer: >60 mL/min (ref >60)
GFR calc non Af Amer: >60 mL/min (ref >60)
Glucose, Bld: 85 mg/dL (ref 65–99)
Potassium: 4.9 mmol/L (ref 3.5–5.1)
Sodium: 132 mmol/L — ABNORMAL LOW (ref 135–145)
Total Bilirubin: 0.6 mg/dL (ref 0.3–1.2)
Total Protein: 5.7 g/dL — ABNORMAL LOW (ref 6.5–8.1)

## 2017-03-11 MED ORDER — NALBUPHINE HCL 10 MG/ML IJ SOLN
10.0000 mg | Freq: Once | INTRAMUSCULAR | Status: AC
  Administered 2017-03-11: 10 mg via INTRAVENOUS
  Filled 2017-03-11: qty 1

## 2017-03-11 MED ORDER — LABETALOL HCL 5 MG/ML IV SOLN
INTRAVENOUS | Status: AC
  Filled 2017-03-11: qty 16

## 2017-03-11 MED ORDER — FLUOXETINE HCL 20 MG PO CAPS
20.0000 mg | ORAL_CAPSULE | Freq: Every day | ORAL | Status: DC
  Administered 2017-03-11 – 2017-03-14 (×4): 20 mg via ORAL
  Filled 2017-03-11 (×5): qty 1

## 2017-03-11 MED ORDER — NALBUPHINE HCL 10 MG/ML IJ SOLN: 10.0000 mg | INTRAMUSCULAR | Status: DC

## 2017-03-11 MED ORDER — NALBUPHINE HCL 10 MG/ML IJ SOLN
10.0000 mg | INTRAMUSCULAR | Status: AC
  Administered 2017-03-11: 10 mg via INTRAVENOUS
  Filled 2017-03-11: qty 1

## 2017-03-11 MED ORDER — HYDROXYZINE HCL 50 MG PO TABS
50.0000 mg | ORAL_TABLET | Freq: Once | ORAL | Status: AC
  Administered 2017-03-11: 50 mg via ORAL
  Filled 2017-03-11: qty 1

## 2017-03-11 MED ORDER — LABETALOL HCL 5 MG/ML IV SOLN
80.0000 mg | INTRAVENOUS | Status: AC | PRN
  Administered 2017-03-11: 80 mg via INTRAVENOUS

## 2017-03-11 MED ORDER — HYDRALAZINE HCL 20 MG/ML IJ SOLN
10.0000 mg | Freq: Once | INTRAMUSCULAR | Status: AC | PRN
  Administered 2017-03-11: 10 mg via INTRAVENOUS

## 2017-03-11 MED ORDER — NIFEDIPINE ER OSMOTIC RELEASE 30 MG PO TB24
30.0000 mg | ORAL_TABLET | Freq: Every day | ORAL | Status: DC
  Administered 2017-03-11: 30 mg via ORAL
  Filled 2017-03-11: qty 1

## 2017-03-11 NOTE — Progress Notes (Addendum)
S:  Reports feeling better since repeat dose of nubain                   anxious just staying in room - wants door to room open                   wants to go home as soon as it is safe to do so                  Hx anxiety and depression postpartum - used prozac previously & desires restart             Tolerating po/ No nausea or vomiting             Bleeding is light             Pain controlled with motrin             Up ad lib / ambulatory / voiding QS  Newborn formula feeding - decided too stressful to try to continue breastfeeding   O:               VS: BP (!) 172/97 (BP Location: Left Arm)   Pulse (!) 102   Temp 98.5 F (36.9 C) (Oral)   Resp 20   Ht  (1.702 m)   Wt 95.7 kg (211 lb)   SpO2 99%   Breastfeeding? Unknown   BMI 33.05 kg/m    LABS:              Recent Labs  03/09/17 0724 03/11/17 0831  WBC 14.7* 13.6*  HGB 12.1 12.2  PLT 152 156               Blood type: --/--/A POS (04/09 1536)  Rubella: Immune (09/22 0000)                     I&O: Intake/Output      04/11 0701 - 04/12 0700 04/12 0701 - 04/13 0700   P.O. 1720    I.V. (mL/kg)     Total Intake(mL/kg) 1720 (18)    Urine (mL/kg/hr) 5550 (2.4) 3400 (3.6)   Stool  0 (0)   Total Output 5550 3400   Net -3830 -3400        Stool Occurrence  1 x                 Physical Exam:             Alert and oriented X3  Lungs: Clear and unlabored  Heart: regular rate and rhythm / no mumurs  Abdomen: soft, non-tender, non-distended              Fundus: firm, non-tender, U-1  Extremities: decreasing edema, no calf pain or tenderness    A: PPD # 3              Severe preeclampsia - persistent labile hypertension             Acute anxiety  Doing well - stable status  P: Routine post partum orders  Per Dr Billy Coast- add procardia 30XL and continue HCTZ 25 x 7 days             Added prozac  daily             Recheck after next BP ~2000 for PM plan of care Agree with plan of care Patient seen and  examined Continue labetalol and  procardia. DC HCTZ when diuresis complete.               Marlinda Mike CNM, MSN, [facility] 03/11/2017, 4:46 PM

## 2017-03-11 NOTE — Progress Notes (Addendum)
PPD 3 SVD / severe preeclampsia  S:  Reports feeling really anxious and agitated -feels shaky and heart beating really fast / did not sleep well last night / feels "worked up"              No PIH symptoms or headache or epigastric pain             Tolerating po/ No nausea or vomiting             Bleeding is light             Pain controlled with motrin             Up ad lib / ambulatory / voiding QS  Newborn breast feeding - added formula supplementation   O:  VS: BP (!) 166/96 (BP Location: Left Arm)   Pulse 82   Temp 98.1 F (36.7 C) (Oral)   Resp 18   Ht  (1.702 m)   Wt 95.7 kg (211 lb)   SpO2 99%   Breastfeeding? Unknown   BMI 33.05 kg/m    Bp: 163/92 -167/99 - 174/99 - 160/90 (BP stable) Pulse from 82 to 120 with acute agitation and anxiety  Weight 4/11 =212 Weight 4/12 = 210   LABS:              Recent Labs  03/08/17 2243 03/09/17 0724  WBC 13.7* 14.7*  HGB 12.2 12.1  PLT 144* 152               Blood type: --/--/A POS (04/09 1536)  Rubella: Immune (09/22 0000)                     I&O: net negative  (down 4L in past 24hours)              Repeat PIH labs - LE normal with stable platelets              Physical Exam:             Alert and oriented X3, anxious and shaky  Lungs: clear and unlabored  Heart: regular rate and rhythm / no mumurs  Abdomen: soft, non-tender, non-distended              Fundus: firm, non-tender, U-1  Perineum: no edema  Lochia: light  Extremities: 2+edema - decreased, no calf pain or tenderness    A: PPD # 3              Severe preeclampsia -improving                   diuresis 4L in past 24 hours with HCTZ  dose             Persistent hypertension but stable in labetalol 200 TID             Acute anxiety reaction / poor coping due to sleep deprivation          P: routine post partum orders             nubain 10 IV now for anxiety - encouraged to sleep              repeat HCTZ  dose today and continue  labetalol 200TID             re-evaluate this pm for possible DC home             update Dr  Kasen Adduci after re-assessment  Patient seen and examined. Labile HTN. History of Severe PEC s/p Mag x 36hrs with good diuresis.    Marlinda Mike CNM, MSN, [facility] 03/11/2017, 8:06 AM

## 2017-03-11 NOTE — Progress Notes (Signed)
Marlinda Mike CNM notified of patient's increased blood pressure of 165/99, HR of 107. No new orders at this time.

## 2017-03-11 NOTE — Lactation Note (Signed)
This note was copied from a baby's chart. Lactation Consultation Note Mom requesting formula for supplementing d/t cluster feeding. Explained to RN normal. RN stated she explained that to mom. Asked RN to set up DEBP.  LC unable to go when RN called.  LC to rm, mom sleeping. Mom's b/p elevated. Will go back to consult. RN unsure of how to set up DEBP.  Mom shown how to use DEBP & how to disassemble, clean, & reassemble parts. #27 flange. Mom knows to pump q3h for 15-20 min. Hand expression taught. Mom has generalized edema to body. Noted breast heavy, tight feeling. Has large raspberry nipples w/long shaft. Hand expression w/colostrum noted. Has coconut oil. Discussed cluster feeding, supply and demand.  Baby's lips starting to dry. Baby has tight frenulum w/limited tongue mobility can't extended past gums. w/gloved finger assessed suck. Baby chomping after a minute or so, will suckle on finger. LC feels that weight loss if d/t transfer issues. Fitted mom w/24 NS. Adjusted flange to open wide denies painful latch.  Baby BF great. Formula inserted w/curve tip syring into NS.  Mom's b/p elevated, encouraged to rest then pump.  Baby appears slight jaundice.  Patient Name: Leah Davis WGNFA'O Date: 03/11/2017 Reason for consult: Follow-up assessment;Infant weight loss;Difficult latch   Maternal Data    Feeding Feeding Type: Formula Length of feed: 30 min  LATCH Score/Interventions Latch: Grasps breast easily, tongue down, lips flanged, rhythmical sucking. Intervention(s): Adjust position;Assist with latch;Breast massage;Breast compression  Audible Swallowing: A few with stimulation Intervention(s): Skin to skin;Hand expression;Alternate breast massage  Type of Nipple: Everted at rest and after stimulation  Comfort (Breast/Nipple): Filling, red/small blisters or bruises, mild/mod discomfort  Problem noted: Mild/Moderate discomfort Interventions (Mild/moderate discomfort): Hand  massage;Hand expression;Reverse pressue;Pre-pump if needed;Post-pump;Comfort gels;Breast shields  Hold (Positioning): Assistance needed to correctly position infant at breast and maintain latch. Intervention(s): Breastfeeding basics reviewed;Support Pillows;Position options;Skin to skin  LATCH Score: 7  Lactation Tools Discussed/Used Tools: Shells;Pump;Nipple Shields Nipple shield size: 24 Shell Type: Inverted Breast pump type: Double-Electric Breast Pump Pump Review: Setup, frequency, and cleaning;Milk Storage Initiated by:: Peri Jefferson RN IBCLC Date initiated:: 03/11/17   Consult Status Consult Status: Follow-up Date: 03/11/17 Follow-up type: In-patient    Charyl Dancer 03/11/2017, 7:29 AM

## 2017-03-11 NOTE — Progress Notes (Signed)
Fredric Mare CNM notified via phone of BP 191/104, taken by RN while patient was not experiencing high anxiety. CNM verbal order to give patient  Vystaril PO now to help patient relax and sleep, scheduled labetalol PO at 2200, and to re-evaluate BP at midnight. RN expressed concern about treating BP with oral medication, CNM states that RN can continue to evaluate BP every hour but advises against checking BP more frequently than that.

## 2017-03-11 NOTE — Progress Notes (Signed)
Marlinda Mike CNM notified of pt's increased blood pressures and reports of feeling short of breath (99% O2). CNM to come see pt. RN instructed to check BPs every and labs ordered.

## 2017-03-11 NOTE — Progress Notes (Signed)
Called to check on patient status - RTC from nurse with update Reports patient is sound asleep and BP improved 157/90.  Continue current course of action - repeat BP at 0400 No further lab or medication orders indicated at this time Next scheduled labetalol 0600 - may consider adjustment to  TID if 0400 BP remains above desired range  Marlinda Mike CNM [facility]

## 2017-03-11 NOTE — Progress Notes (Signed)
  TC from nurse to report BP - asked about patient emotional status this evening. (earlier emotional outbreaks in the day - not sleeping) States has been very anxious but gave her lavender to calm her before checking her BP. Reports no other symptoms or PIH signs.  I gave verbal order for vistaril  - reminded we added Procardia 30XL this evening and her next dose of labetalol due at 2200 and should see some effects by her next VS check which was scheduled around midnight & requested update on her status then.  Nurse stated "Vistaril is not going to touch her - she will not sleep with that" - additionally added "I am not comfortable waiting til midnight with stroke level blood pressures"   Explained the standard Vistaril dose is  and the 50 mg dose is usually adequate for anxiety and sleep. I told nurse that she could recheck BP whenever she felt it was needed. I mentioned  that the patient had complained about being stressed out earlier today with BP every 5 minutes  Requested BP no sooner than 1 hour if stable. Requested status update at midnight.   PLAN: Attempt to control patient anxiety while adequately monitoring patient status Vistaril  for anxiety and rest Reassess BP status at midnight when procardia dose should begin to peak and next labetalol dose in system  Marlinda Mike CNM [facility]

## 2017-03-11 NOTE — Progress Notes (Addendum)
TC from L&D nurse - came upon patient outside looking pale and anxious. Wanted provider to be aware.  Instructed to tell patient to return to her room - monitoring her BP and should not be unattended and should not be outside of the hospital.  Patient reportedly needed air - feeling anxious in room.  Vital signs due 1600 with provider re-evaluation planned for that time.   Consulted with Dr Billy Coast regarding plan of care for patient after review of status and persistent hypertension with new onset anxiety and tachycardia. Discussed plan of care with CNM . Agree with aggressive anti-htn regimen and consider anxiolytic.  Marlinda Mike CNM

## 2017-03-12 LAB — COMPREHENSIVE METABOLIC PANEL
ALT: 62 U/L — ABNORMAL HIGH (ref 14–54)
AST: 88 U/L — ABNORMAL HIGH (ref 15–41)
Albumin: 2.7 g/dL — ABNORMAL LOW (ref 3.5–5.0)
Alkaline Phosphatase: 126 U/L (ref 38–126)
Anion gap: 7 (ref 5–15)
BUN: 12 mg/dL (ref 6–20)
CO2: 22 mmol/L (ref 22–32)
Calcium: 8.4 mg/dL — ABNORMAL LOW (ref 8.9–10.3)
Chloride: 109 mmol/L (ref 101–111)
Creatinine, Ser: 0.63 mg/dL (ref 0.44–1.00)
GFR calc Af Amer: >60 mL/min (ref >60)
GFR calc non Af Amer: >60 mL/min (ref >60)
Glucose, Bld: 78 mg/dL (ref 65–99)
Potassium: 4.1 mmol/L (ref 3.5–5.1)
Sodium: 138 mmol/L (ref 135–145)
Total Bilirubin: 0.6 mg/dL (ref 0.3–1.2)
Total Protein: 5.7 g/dL — ABNORMAL LOW (ref 6.5–8.1)

## 2017-03-12 LAB — CBC
HCT: 34.9 % — ABNORMAL LOW (ref 36.0–46.0)
Hemoglobin: 11.6 g/dL — ABNORMAL LOW (ref 12.0–15.0)
MCH: 29.7 pg (ref 26.0–34.0)
MCHC: 33.2 g/dL (ref 30.0–36.0)
MCV: 89.5 fL (ref 78.0–100.0)
Platelets: 185 10*3/uL (ref 150–400)
RBC: 3.9 MIL/uL (ref 3.87–5.11)
RDW: 14.3 % (ref 11.5–15.5)
WBC: 11.3 10*3/uL — ABNORMAL HIGH (ref 4.0–10.5)

## 2017-03-12 MED ORDER — ALPRAZOLAM 0.5 MG PO TABS
0.5000 mg | ORAL_TABLET | Freq: Two times a day (BID) | ORAL | Status: DC | PRN
  Administered 2017-03-12 – 2017-03-14 (×5): 0.5 mg via ORAL
  Filled 2017-03-12 (×6): qty 1

## 2017-03-12 MED ORDER — DOCUSATE SODIUM 50 MG/5ML PO LIQD: 100.0000 mg | Freq: Two times a day (BID) | ORAL | Status: DC | PRN

## 2017-03-12 MED ORDER — HYDROCHLOROTHIAZIDE 12.5 MG PO CAPS
12.5000 mg | ORAL_CAPSULE | Freq: Every day | ORAL | Status: DC
  Administered 2017-03-12: 12.5 mg via ORAL
  Filled 2017-03-12 (×2): qty 1

## 2017-03-12 MED ORDER — LABETALOL HCL 5 MG/ML IV SOLN
20.0000 mg | Freq: Once | INTRAVENOUS | Status: AC
  Administered 2017-03-12: 20 mg via INTRAVENOUS

## 2017-03-12 MED ORDER — HYDRALAZINE HCL 20 MG/ML IJ SOLN
INTRAMUSCULAR | Status: AC
Start: 2017-03-12 — End: 2017-03-12
  Administered 2017-03-12: 5 mg via INTRAVENOUS
  Filled 2017-03-12: qty 1

## 2017-03-12 MED ORDER — NIFEDIPINE ER OSMOTIC RELEASE 30 MG PO TB24
60.0000 mg | ORAL_TABLET | Freq: Every day | ORAL | Status: DC
  Administered 2017-03-12 – 2017-03-15 (×4): 60 mg via ORAL
  Filled 2017-03-12 (×4): qty 2

## 2017-03-12 MED ORDER — HYDRALAZINE HCL 10 MG PO TABS
10.0000 mg | ORAL_TABLET | Freq: Three times a day (TID) | ORAL | Status: DC
  Administered 2017-03-12 – 2017-03-15 (×10): 10 mg via ORAL
  Filled 2017-03-12 (×11): qty 1

## 2017-03-12 MED ORDER — LABETALOL HCL 5 MG/ML IV SOLN
20.0000 mg | Freq: Once | INTRAVENOUS | Status: AC
  Administered 2017-03-12: 20 mg via INTRAVENOUS
  Filled 2017-03-12: qty 4

## 2017-03-12 MED ORDER — LABETALOL HCL 5 MG/ML IV SOLN
20.0000 mg | INTRAVENOUS | Status: DC | PRN
  Filled 2017-03-12 (×2): qty 4

## 2017-03-12 MED ORDER — NIFEDIPINE 10 MG PO CAPS
10.0000 mg | ORAL_CAPSULE | Freq: Once | ORAL | Status: AC
  Administered 2017-03-12: 10 mg via ORAL
  Filled 2017-03-12: qty 1

## 2017-03-12 MED ORDER — DOCUSATE SODIUM 100 MG PO CAPS
100.0000 mg | ORAL_CAPSULE | Freq: Two times a day (BID) | ORAL | Status: DC | PRN
  Administered 2017-03-12: 100 mg via ORAL
  Filled 2017-03-12: qty 1

## 2017-03-12 MED ORDER — HYDRALAZINE HCL 20 MG/ML IJ SOLN
5.0000 mg | Freq: Once | INTRAMUSCULAR | Status: AC
  Administered 2017-03-12: 5 mg via INTRAVENOUS
  Filled 2017-03-12: qty 1

## 2017-03-12 MED ORDER — HYDRALAZINE HCL 20 MG/ML IJ SOLN
5.0000 mg | INTRAMUSCULAR | Status: AC | PRN
Start: 2017-03-12 — End: 2017-03-13
  Administered 2017-03-12: 10 mg via INTRAVENOUS
  Administered 2017-03-13: 5 mg via INTRAVENOUS
  Filled 2017-03-12 (×2): qty 1

## 2017-03-12 MED ORDER — HYDRALAZINE HCL 20 MG/ML IJ SOLN
10.0000 mg | Freq: Once | INTRAMUSCULAR | Status: AC
  Administered 2017-03-12: 10 mg via INTRAVENOUS

## 2017-03-12 MED ORDER — HYDRALAZINE HCL 20 MG/ML IJ SOLN
INTRAMUSCULAR | Status: AC
  Administered 2017-03-12: 10 mg via INTRAVENOUS
  Filled 2017-03-12: qty 1

## 2017-03-12 MED ORDER — ACETAMINOPHEN 500 MG PO TABS
1000.0000 mg | ORAL_TABLET | Freq: Once | ORAL | Status: AC
  Administered 2017-03-12: 1000 mg via ORAL
  Filled 2017-03-12: qty 2

## 2017-03-12 NOTE — Progress Notes (Signed)
Dr Ernestina Penna updated on blood pressure recheck.  RN instructed to resume q4hr vital sign checks.

## 2017-03-12 NOTE — Progress Notes (Signed)
Dr. Ernestina Penna notified of repeat B/P of 167/93.  MD requests RN to recheck B/P in 1 hour and call with results.
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
## 2017-03-12 NOTE — Progress Notes (Signed)
Patient ID: Leah Davis, female   DOB: [birth year], 31 y.o.   MRN: [id number]  Elevated BP, not responding to PO meds. RN gave IV 5 mg and then  Hydralazine. Called with BPs still 180-191/100s Advised repeat Hydralazine  and add Labetalol  IV   Continue to monitor. Will transfer care to on call MD now Dr Ernestina Penna

## 2017-03-12 NOTE — Progress Notes (Signed)
Patient ID: Leah Davis, female   DOB: 09/19/1986, 31 y.o.   MRN: [id number] PPD #4 SVD GIRL, 03/08/17, IOL for Severe PEC  Subjective: Anxiety. No complaints   Objective: Vital signs in last 24 hours: Temp:  [98.2 F (36.8 C)-98.5 F (36.9 C)] 98.4 F (36.9 C) (04/13 0833) Pulse Rate:  [95-107] 95 (04/13 0833) Resp:  [17-20] 17 (04/13 0833) BP: (157-181)/(90-104) 178/95 (04/13 0905) SpO2:  [97 %-99 %] 97 % (04/13 0833) Weight:  [195 lb 8 oz (88.7 kg)] 195 lb 8 oz (88.7 kg) (04/13 0859) Weight change:     Intake/Output from previous day: 04/12 0701 - 04/13 0700 In: -  Out: 6700 [Urine:6700] Intake/Output this shift: Total I/O In: 480 [P.O.:480] Out: 1350 [Urine:1350]  Labs: CBC, CMP stable, no HELLP syndrome  Physical exam:  A&O x 3, no acute distress. Pleasant HEENT neg, no thyromegaly Lungs CTA bilat CV RRR, S1S2 normal Abdo soft, non tender, non acute Extr no edema/ tenderness DTR +2 minimum edema Pelvic no active bleeding.   Medications: Labetalol  tid, HTCZ 12.5 mg daily (reduced from 25 due 11 lit diuresis), Procardia 60 mg XL (increased from  XL this AM) and added Hydralazine  TID (added this AM)  Assessment/Plan: LOS: 4 days  Postpartum day 4, A(+), Rub Immune.  GIRL- bottle feeding, doing well, stable with mother  Severe HTN / Severe PEC, diuresing well.  Extended stay post delivery due to severe HTN, poorly controlled, on multiple agents. Changes to meds made, continue to watch closely and titrate, excellent urine output Anxiety - discuss starting anxiolytics   Counseled for 30 min about condition, plan and need to optimize BP control before Discharge   Senta Kantor R 03/12/2017, 9:39 AM

## 2017-03-12 NOTE — Progress Notes (Signed)
MD notified of patient's response to blood pressure treatment and elevation of AST and ALT on today's lab work.  RN instructed to call back with next blood pressure reading.

## 2017-03-13 LAB — COMPREHENSIVE METABOLIC PANEL
ALBUMIN: 3.1 g/dL — AB (ref 3.5–5.0)
ALT: 93 U/L — ABNORMAL HIGH (ref 14–54)
AST: 83 U/L — AB (ref 15–41)
Alkaline Phosphatase: 143 U/L — ABNORMAL HIGH (ref 38–126)
Anion gap: 9 (ref 5–15)
BUN: 16 mg/dL (ref 6–20)
CHLORIDE: 106 mmol/L (ref 101–111)
CO2: 21 mmol/L — AB (ref 22–32)
Calcium: 8.7 mg/dL — ABNORMAL LOW (ref 8.9–10.3)
Creatinine, Ser: 0.56 mg/dL (ref 0.44–1.00)
GFR calc Af Amer: >60 mL/min (ref >60)
GFR calc non Af Amer: >60 mL/min (ref >60)
GLUCOSE: 89 mg/dL (ref 65–99)
POTASSIUM: 3.7 mmol/L (ref 3.5–5.1)
Sodium: 136 mmol/L (ref 135–145)
Total Bilirubin: 0.5 mg/dL (ref 0.3–1.2)
Total Protein: 6.8 g/dL (ref 6.5–8.1)

## 2017-03-13 LAB — CBC WITH DIFFERENTIAL/PLATELET
Basophils Absolute: 0 10*3/uL (ref 0.0–0.1)
Basophils Relative: 0 %
EOS PCT: 3 %
Eosinophils Absolute: 0.4 10*3/uL (ref 0.0–0.7)
HCT: 41.5 % (ref 36.0–46.0)
Hemoglobin: 13.8 g/dL (ref 12.0–15.0)
LYMPHS ABS: 2.4 10*3/uL (ref 0.7–4.0)
LYMPHS PCT: 19 %
MCH: 29.4 pg (ref 26.0–34.0)
MCHC: 33.3 g/dL (ref 30.0–36.0)
MCV: 88.5 fL (ref 78.0–100.0)
MONOS PCT: 4 %
Monocytes Absolute: 0.5 10*3/uL (ref 0.1–1.0)
Neutro Abs: 9.1 10*3/uL — ABNORMAL HIGH (ref 1.7–7.7)
Neutrophils Relative %: 74 %
PLATELETS: 252 10*3/uL (ref 150–400)
RBC: 4.69 MIL/uL (ref 3.87–5.11)
RDW: 14.3 % (ref 11.5–15.5)
WBC: 12.4 10*3/uL — AB (ref 4.0–10.5)

## 2017-03-13 MED ORDER — MAGNESIUM SULFATE BOLUS VIA INFUSION
4.0000 g | Freq: Once | INTRAVENOUS | Status: AC
  Administered 2017-03-13: 4 g via INTRAVENOUS
  Filled 2017-03-13: qty 500

## 2017-03-13 MED ORDER — LACTATED RINGERS IV SOLN
INTRAVENOUS | Status: DC
  Administered 2017-03-13 – 2017-03-14 (×3): via INTRAVENOUS

## 2017-03-13 MED ORDER — MAGNESIUM SULFATE 40 G IN LACTATED RINGERS - SIMPLE
2.0000 g/h | INTRAVENOUS | Status: DC
  Administered 2017-03-14: 2 g/h via INTRAVENOUS
  Filled 2017-03-13 (×2): qty 500

## 2017-03-13 MED ORDER — PANTOPRAZOLE SODIUM 40 MG PO TBEC
40.0000 mg | DELAYED_RELEASE_TABLET | Freq: Every day | ORAL | Status: DC
  Administered 2017-03-13 – 2017-03-15 (×3): 40 mg via ORAL
  Filled 2017-03-13 (×3): qty 1

## 2017-03-13 MED ORDER — LABETALOL HCL 200 MG PO TABS
200.0000 mg | ORAL_TABLET | Freq: Three times a day (TID) | ORAL | Status: DC
  Administered 2017-03-13 – 2017-03-15 (×7): 200 mg via ORAL
  Filled 2017-03-13 (×7): qty 1

## 2017-03-13 MED ORDER — LABETALOL HCL 5 MG/ML IV SOLN
20.0000 mg | Freq: Once | INTRAVENOUS | Status: AC
  Administered 2017-03-13: 20 mg via INTRAVENOUS

## 2017-03-13 NOTE — Progress Notes (Signed)
Postpartum day #5, spontaneous vaginal delivery after induction of labor for severe preeclampsia.  Continued labile blood pressures  Subjective: Patient states "I feel well other than feeling anxious" patient continues to ask: "Am I going to die?". Patient currently worried about her elevated pulse. Patient notes no headache, no shortness of breath, no chest pain. Patient does note slight left-sided chest pressure. No fevers. Minimal vaginal bleeding. Patient is not breast-feeding. Tolerating regular by mouth. Voiding normally. Pain controlled. No right upper quadrant pain. Patient states this morning she felt a little ""jittery." Patient states she slept well overnight.  Blood pressures remained in the systolics in the 160s through the day yesterday despite the increased to the  XL Procardia. She was also started on hydralazine 10 by mouth 3 times a day yesterday. Yesterday morning she needed several IV push medications including hydralazine 5, 10 and 20 minutes of labetalol. She remained asymptomatic. She was started on anti-anxiety medications  Early this morning while resting patient had a systolic of 175. Given that her LFTs remained elevated again today the labile blood pressures, the decision was made to restart magnesium sulfate.  PE. Vitals:   03/13/17 1205 03/13/17 1213 03/13/17 1300 03/13/17 1346  BP: (!) 175/109 (!) 158/99 (!) 166/102 (!) 151/97  Pulse: (!) 117 (!) 115 (!) 107 89  Resp: Temp: 98.6 F (37 C)     TempSrc:      SpO2: 99% 98% 99% 98%  Weight:      Height:       Gen.: Anxious but in no acute distress Cardiovascular: Tachycardic regular rhythm no murmur Pulmonary: Clear to auscultation bilaterally, no crackles no wheeze Abdomen: No right upper quadrant pain, fundus below the umbilicus, nontender and firm GU: Deferred Lower extremity: No clonus, trace edema bilaterally equal, 4+ DTR  CBC Latest Ref Rng & Units 03/12/2017 03/11/2017 03/09/2017  WBC  4.0 - 10.5 K/uL 11.3(H) 13.6(H) 14.7(H)  Hemoglobin 12.0 - 15.0 g/dL 11.6(L) 12.2 12.1  Hematocrit 36.0 - 46.0 % 34.9(L) 36.9 36.0  Platelets 150 - 400 K/uL 185 156 152   CMP Latest Ref Rng & Units 03/13/2017 03/12/2017 03/11/2017  Glucose 65 - 99 mg/dL 89 78 85  BUN 6 - 20 mg/dL Creatinine 0.44 - 1.00 mg/dL 1.61 0.96 0.45  Sodium 135 - 145 mmol/L 136 138 132(L)  Potassium 3.5 - 5.1 mmol/L 3.7 4.1 4.9  Chloride 101 - 111 mmol/L 106 109 105  CO2 22 - 32 mmol/L 21(L) 22 20(L)  Calcium 8.9 - 10.3 mg/dL 4.0(J) 8.1(X) 9.1(Y)  Total Protein 6.5 - 8.1 g/dL 6.8 7.8(G) 5.7(L)  Total Bilirubin 0.3 - 1.2 mg/dL 0.5 0.6 0.6  Alkaline Phos 38 - 126 U/L 143(H) 126 137(H)  AST 15 - 41 U/L 83(H) 88(H) 29  ALT 14 - 54 U/L 93(H) 62(H) 19   Assessment plan: 31 year old G2 P1102 postpartum day #5 with severe preeclampsia - Labile blood pressures. We will continue to watch these. If her pulse remains elevated may add 3 times a day labetalol to her Procardia and hydralazine. Given the elevated blood pressures we have restarted magnesium sulfate though over the first 6 hours the magnesium has not significantly reduced her blood pressures. Acute IV push medicines have been helpful in bringing her BP is down and we will likely continue to need these as we titrate her medications.   there is likely a component of anxiety.  - Preeclampsia. LFTs remain elevated and her  reflexes are very brisk. Magnesium restarted. Will repeat LFTs and magnesium level tomorrow - Tachycardia. Likely due to her anxiety. CBC has remained stable but will repeat tomorrow - Anxiety. On Ativan and Zoloft - Patient's remain inpatient monitoring given complexity of care - Not breast-feeding - Chest pressure. Multifactorial. Anxiety, tachycardia. Some degree of breast engorgement likely as patient is not breast-feeding. Will give protonix for possible reflux component.   Shelby Peltz A. 03/13/2017 2:31 PM

## 2017-03-14 LAB — COMPREHENSIVE METABOLIC PANEL
ALT: 69 U/L — AB (ref 14–54)
AST: 35 U/L (ref 15–41)
Albumin: 3.1 g/dL — ABNORMAL LOW (ref 3.5–5.0)
Alkaline Phosphatase: 138 U/L — ABNORMAL HIGH (ref 38–126)
Anion gap: 9 (ref 5–15)
BILIRUBIN TOTAL: 0.1 mg/dL — AB (ref 0.3–1.2)
BUN: 14 mg/dL (ref 6–20)
CALCIUM: 7.6 mg/dL — AB (ref 8.9–10.3)
CO2: 22 mmol/L (ref 22–32)
CREATININE: 0.52 mg/dL (ref 0.44–1.00)
Chloride: 103 mmol/L (ref 101–111)
Glucose, Bld: 90 mg/dL (ref 65–99)
Potassium: 4 mmol/L (ref 3.5–5.1)
Sodium: 134 mmol/L — ABNORMAL LOW (ref 135–145)
TOTAL PROTEIN: 6.8 g/dL (ref 6.5–8.1)

## 2017-03-14 NOTE — Progress Notes (Signed)
Postpartum day #6, spontaneous vaginal delivery after induction of labor for severe preeclampsia.  Improved bp's after addition of labetalol yesterday afternoon. Last IV push meds 2p 4/14.  Subjective: Pt notes anxiety improved, needing prn Ativan. Pt notes mild HA/ pressure. No shortness of breath, no chest pain. No fevers. Minimal vaginal bleeding. Patient is not breast-feeding. Tolerating regular by mouth. Voiding normally. Pain controlled. No right upper quadrant pain. Pt notes some hot flushes since starting Mag.   Blood pressures improving. Pulse lower.   Increased to  XL Procardia on 4/13. She was also started on hydralazine 10 by mouth 3 times  On 4/13. On 4/14 labetalol 200 po q8hrs started.    PE. Vitals:   03/14/17 0400 03/14/17 0500 03/14/17 0614 03/14/17 0840  BP: (!) 150/83   (!) 148/94  Pulse: 89   88  Resp: Temp: 97.6 F (36.4 C)   98.2 F (36.8 C)  TempSrc:      SpO2: 98% 99% 100% 100%  Weight: 84.8 kg (187 lb)     Height:       Gen.: No acute distress Cardiovascular: Regular rhythm no murmur Pulmonary: Clear to auscultation bilaterally, no crackles no wheeze Abdomen: No right upper quadrant pain, fundus below the umbilicus, nontender and firm GU: Deferred Lower extremity: No clonus, trace edema bilaterally equal, 4+ DTR  CBC Latest Ref Rng & Units 03/13/2017 03/12/2017 03/11/2017  WBC 4.0 - 10.5 K/uL 12.4(H) 11.3(H) 13.6(H)  Hemoglobin 12.0 - 15.0 g/dL 16.1 11.6(L) 12.2  Hematocrit 36.0 - 46.0 % 41.5 34.9(L) 36.9  Platelets 150 - 400 K/uL 252 185 156   CMP Latest Ref Rng & Units 03/14/2017 03/13/2017 03/12/2017  Glucose 65 - 99 mg/dL 90 89 78  BUN 6 - 20 mg/dL Creatinine 0.44 - 1.00 mg/dL 0.96 0.45 4.09  Sodium 135 - 145 mmol/L 134(L) 136 138  Potassium 3.5 - 5.1 mmol/L 4.0 3.7 4.1  Chloride 101 - 111 mmol/L 103 106 109  CO2 22 - 32 mmol/L 22 21(L) 22  Calcium 8.9 - 10.3 mg/dL 7.6(L) 8.7(L) 8.4(L)  Total Protein 6.5 - 8.1 g/dL 6.8  6.8 8.1(X)  Total Bilirubin 0.3 - 1.2 mg/dL 9.1(Y) 0.5 0.6  Alkaline Phos 38 - 126 U/L 138(H) 143(H) 126  AST 15 - 41 U/L 35 83(H) 88(H)  ALT 14 - 54 U/L 69(H) 93(H) 62(H)   Assessment plan: 31 year old G2 P1102 postpartum day #6 with severe preeclampsia - Labile blood pressures. Improved over the past 24 hrs. Recently added labetalol to her Procardia and hydralazine. OK to d/c magnesium and continue to watch closely.   - Preeclampsia. LFTs improving. D/c Mag now 24 hrs after restarting. Reflexes remain brisk. Magnesium restarted. Will repeat LFTs tomorrow - Tachycardia. Improved on labetalol. Likely component of anxiety. CBC has remained stable.  - Anxiety. On Ativan and Zoloft - Patient's remain inpatient monitoring given complexity of care. Possible d/c home tomorrow - Not breast-feeding   Kelvin Sennett A. 03/14/2017 12:00 PM

## 2017-03-15 LAB — COMPREHENSIVE METABOLIC PANEL
ALK PHOS: 130 U/L — AB (ref 38–126)
ALT: 52 U/L (ref 14–54)
AST: 24 U/L (ref 15–41)
Albumin: 3.2 g/dL — ABNORMAL LOW (ref 3.5–5.0)
Anion gap: 9 (ref 5–15)
BUN: 18 mg/dL (ref 6–20)
CALCIUM: 8.8 mg/dL — AB (ref 8.9–10.3)
CO2: 21 mmol/L — ABNORMAL LOW (ref 22–32)
CREATININE: 0.63 mg/dL (ref 0.44–1.00)
Chloride: 107 mmol/L (ref 101–111)
Glucose, Bld: 77 mg/dL (ref 65–99)
Potassium: 4.3 mmol/L (ref 3.5–5.1)
Sodium: 137 mmol/L (ref 135–145)
Total Bilirubin: 0.5 mg/dL (ref 0.3–1.2)
Total Protein: 6.8 g/dL (ref 6.5–8.1)

## 2017-03-15 MED ORDER — FLUOXETINE HCL 20 MG PO CAPS
20.0000 mg | ORAL_CAPSULE | Freq: Every day | ORAL | 3 refills | Status: AC
Start: 2017-03-15 — End: ?

## 2017-03-15 MED ORDER — NIFEDIPINE ER 60 MG PO TB24: 60.0000 mg | ORAL_TABLET | Freq: Every day | ORAL | 1 refills | Status: DC

## 2017-03-15 MED ORDER — LABETALOL HCL 200 MG PO TABS: 300.0000 mg | ORAL_TABLET | Freq: Three times a day (TID) | ORAL | 2 refills | Status: DC

## 2017-03-15 NOTE — Progress Notes (Signed)
Postpartum day #7 Severe PEC  Improved bp's after addition of labetalol  Last IV push meds 2p 4/14.  Subjective: Pt notes anxiety improved. Feels hospital related.  Pt notes no HA/ pressure. No shortness of breath, no chest pain. No fevers. Minimal vaginal bleeding. Patient is not breast-feeding. Tolerating regular by mouth. Voiding normally. Pain controlled. No right upper quadrant pain.    Blood pressures improving.   Increased to  XL Procardia on 4/13. She was also started on hydralazine 10 by mouth 3 times  On 4/13. On 4/14 labetalol 200 po q8hrs started.    PE. Vitals:   03/14/17 1530 03/14/17 2027 03/14/17 2331 03/15/17 0532  BP: (!) 142/81 (!) 165/92 (!) 159/92 (!) 160/92  Pulse: 87 (!) 113 91 86  Resp: Temp: 98.4 F (36.9 C) 98.5 F (36.9 C) 98.4 F (36.9 C) 98.6 F (37 C)  TempSrc: Oral Oral Oral Oral  SpO2: 100% 99% 98% 100%  Weight:    83.3 kg (183 lb 12 oz)  Height:       Gen.: No acute distress, WDWN Cardiovascular: Regular rhythm no murmur Pulmonary: Clear to auscultation bilaterally, no crackles no wheeze Abdomen: No right upper quadrant pain, fundus below the umbilicus, nontender and firm GU: nl lochia Lower extremity: No clonus, trace edema bilaterally equal, 3+ DTR Neuro: nonfocal Skin: intact  CBC Latest Ref Rng & Units 03/13/2017 03/12/2017 03/11/2017  WBC 4.0 - 10.5 K/uL 12.4(H) 11.3(H) 13.6(H)  Hemoglobin 12.0 - 15.0 g/dL 16.1 11.6(L) 12.2  Hematocrit 36.0 - 46.0 % 41.5 34.9(L) 36.9  Platelets 150 - 400 K/uL 252 185 156   CMP Latest Ref Rng & Units 03/15/2017 03/14/2017 03/13/2017  Glucose 65 - 99 mg/dL 77 90 89  BUN 6 - 20 mg/dL Creatinine 0.44 - 1.00 mg/dL 0.96 0.45 4.09  Sodium 135 - 145 mmol/L 137 134(L) 136  Potassium 3.5 - 5.1 mmol/L 4.3 4.0 3.7  Chloride 101 - 111 mmol/L 107 103 106  CO2 22 - 32 mmol/L 21(L) 22 21(L)  Calcium 8.9 - 10.3 mg/dL 8.1(X) 7.6(L) 8.7(L)  Total Protein 6.5 - 8.1 g/dL 6.8 6.8 6.8  Total  Bilirubin 0.3 - 1.2 mg/dL 0.5 9.1(Y) 0.5  Alkaline Phos 38 - 126 U/L 130(H) 138(H) 143(H)  AST 15 - 41 U/L 24 35 83(H)  ALT 14 - 54 U/L 52 69(H) 93(H)   Assessment plan: 31 year old G2 P1102 postpartum day #7 with severe preeclampsia - Labile blood pressures. Improved over the past 24 hrs. controlled labetalol / Procardia and hydralazine. OK to d/c magnesium and continue to watch closely.  DC hydralazine - Preeclampsia. LFTs improving. Off Mag . Reflexes remain brisk. Repeat LFTs today wnl - Anxiety. On Prozac, dc Ativan - Patient's remain inpatient monitoring given complexity of care. Possible d/c home later today    Violette Morneault J 03/15/2017 6:42 AM

## 2017-03-15 NOTE — Lactation Note (Signed)
Lactation Consultation Note  Patient Name: Leah Davis [id number] Date: 03/15/2017   Follow up with mom due to readmission. Mom reports she has switched to formula feeding. She reports she experienced some engorgement that is resolving. She declined need to for assistance.      Maternal Data    Feeding    LATCH Score/Interventions                      Lactation Tools Discussed/Used     Consult Status      Ed Blalock 03/15/2017, 12:14 PM

## 2017-03-15 NOTE — Progress Notes (Signed)
Discharge education complete, prescriptions given, discharge instructions and follow up appointment discussed. Patient verbalized understanding. 

## 2017-03-15 NOTE — Progress Notes (Signed)
Patient ID: Leah Davis, female   DOB: 10-16-1986, 31 y.o.   MRN: [id number] Anxious about BPs. Will dc home. Fu office 4d. PEC and HTN precautions.

## 2017-03-19 DIAGNOSIS — O1424 HELLP syndrome, complicating childbirth: Secondary | ICD-10-CM | POA: Diagnosis not present

## 2017-03-19 DIAGNOSIS — O1414 Severe pre-eclampsia complicating childbirth: Secondary | ICD-10-CM | POA: Diagnosis not present

## 2017-03-19 NOTE — Discharge Summary (Signed)
NAMEAPRILLE, SAWHNEY             ACCOUNT NO.:  [id number]  MEDICAL RECORD NO.:  [id number]  LOCATION:  9304                          FACILITY:  [facility]  PHYSICIAN:  Lenoard Aden, M.D.DATE OF BIRTH:  September 23, 1986  DATE OF ADMISSION:  03/08/2017 DATE OF DISCHARGE:                              DISCHARGE SUMMARY   ADMISSION DIAGNOSIS:  Severe preeclampsia.  DISCHARGE DIAGNOSIS:  Severe preeclampsia.  HOSPITAL COURSE:  The patient was admitted on March 08, 2017, with severe preeclampsia, undergoing induction with magnesium sulfate on board and hypertensive control using IV medications.  She had uncomplicated vaginal delivery followed by postoperative course which was complicated by magnesium use for 24 hours.  She diuresed well.  Blood pressure control became labile.  She was started on multiple antihypertensive medications.  Blood pressure became well controlled over the course of therapy, but then on hospital day #5 her liver function tests became elevated and she was restarted on magnesium sulfate for 24 hours. Subsequent to that time, her liver function tests normalized, her blood pressure normalized, and she was discharged to home on hospital day #7.  DISCHARGE MEDICATIONS:  To include: 1. Labetalol. 2. Procardia. 3. Prenatal vitamins. 4. Iron.  FOLLOWUP:  She is to follow up in the office in 1 week for blood pressure check.  Preeclampsia precautions were given.     Lenoard Aden, M.D.     RJT/MEDQ  D:  03/18/2017  T:  03/19/2017  Job:  [id number]

## 2017-03-26 DIAGNOSIS — O1410 Severe pre-eclampsia, unspecified trimester: Secondary | ICD-10-CM | POA: Diagnosis not present

## 2017-04-28 ENCOUNTER — Encounter: Payer: Self-pay | Admitting: Oncology

## 2017-04-28 ENCOUNTER — Ambulatory Visit (HOSPITAL_BASED_OUTPATIENT_CLINIC_OR_DEPARTMENT_OTHER): Payer: BLUE CROSS/BLUE SHIELD | Admitting: Oncology

## 2017-04-28 ENCOUNTER — Other Ambulatory Visit (HOSPITAL_BASED_OUTPATIENT_CLINIC_OR_DEPARTMENT_OTHER): Payer: BLUE CROSS/BLUE SHIELD

## 2017-04-28 VITALS — BP 154/80 | HR 66 | Temp 98.4°F | Resp 18 | Ht 67.0 in | Wt 179.3 lb

## 2017-04-28 DIAGNOSIS — Z803 Family history of malignant neoplasm of breast: Secondary | ICD-10-CM

## 2017-04-28 DIAGNOSIS — Z1501 Genetic susceptibility to malignant neoplasm of breast: Secondary | ICD-10-CM

## 2017-04-28 DIAGNOSIS — Z1509 Genetic susceptibility to other malignant neoplasm: Principal | ICD-10-CM

## 2017-04-28 LAB — CBC WITH DIFFERENTIAL/PLATELET
BASO%: 0.3 % (ref 0.0–2.0)
BASOS ABS: 0 10*3/uL (ref 0.0–0.1)
EOS ABS: 0.3 10*3/uL (ref 0.0–0.5)
EOS%: 4.4 % (ref 0.0–7.0)
HEMATOCRIT: 37.4 % (ref 34.8–46.6)
HEMOGLOBIN: 12.2 g/dL (ref 11.6–15.9)
LYMPH#: 2.2 10*3/uL (ref 0.9–3.3)
LYMPH%: 35.4 % (ref 14.0–49.7)
MCH: 29 pg (ref 25.1–34.0)
MCHC: 32.6 g/dL (ref 31.5–36.0)
MCV: 89 fL (ref 79.5–101.0)
MONO#: 0.4 10*3/uL (ref 0.1–0.9)
MONO%: 6.3 % (ref 0.0–14.0)
NEUT%: 53.6 % (ref 38.4–76.8)
NEUTROS ABS: 3.2 10*3/uL (ref 1.5–6.5)
Platelets: 221 10*3/uL (ref 145–400)
RBC: 4.2 10*6/uL (ref 3.70–5.45)
RDW: 14.6 % — ABNORMAL HIGH (ref 11.2–14.5)
WBC: 6.1 10*3/uL (ref 3.9–10.3)

## 2017-04-28 LAB — COMPREHENSIVE METABOLIC PANEL
ALBUMIN: 4.3 g/dL (ref 3.5–5.0)
ALK PHOS: 78 U/L (ref 40–150)
ALT: 19 U/L (ref 0–55)
AST: 16 U/L (ref 5–34)
Anion Gap: 6 mEq/L (ref 3–11)
BILIRUBIN TOTAL: 0.4 mg/dL (ref 0.20–1.20)
BUN: 13.7 mg/dL (ref 7.0–26.0)
CALCIUM: 9.4 mg/dL (ref 8.4–10.4)
CO2: 25 mEq/L (ref 22–29)
Chloride: 106 mEq/L (ref 98–109)
Creatinine: 0.8 mg/dL (ref 0.6–1.1)
GLUCOSE: 81 mg/dL (ref 70–140)
Potassium: 4.7 mEq/L (ref 3.5–5.1)
SODIUM: 138 meq/L (ref 136–145)
TOTAL PROTEIN: 6.9 g/dL (ref 6.4–8.3)

## 2017-04-28 NOTE — Progress Notes (Signed)
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 This is one of the common Ashkenazi mutations.  INTERVAL HISTORY: Leah Davis returns today for her BRCA1 screening. Since her last visit here she delivered healthy baby girl 03/08/2017 [facility]). The Apgar was 9. The patient had severe preeclampsia following delivery, but has recovered nicely. Her blood pressure medication is being tapered by Dr. Ronita Hipps  She is a bit behind on her mammography as a result of all this. She has not had a breast MRI in greater than a year  REVIEW OF SYSTEMS: She has not yet returned to her usual exercise routine but is planning to, she says. She will be returning to work in another 3 weeks or so. A detailed review of systems today was otherwise stable  PAST MEDICAL HISTORY: Past Medical History:  Diagnosis Date  . Anxiety   . BRCA1 positive 2014  . Condyloma acuminata 05/2012   see 06/17/2012 note.  . Migraine    WITHOUT AURA  . PMS (premenstrual syndrome)   . Postpartum care following vaginal delivery (7/25) 06/23/2014  . Pregnancy 02/24/2014    PAST SURGICAL HISTORY: Past Surgical History:  Procedure Laterality Date  . ivf      . WISDOM TOOTH EXTRACTION      FAMILY HISTORY Family History  Problem Relation Age of Onset  . Breast cancer Mother 60  . Breast cancer Maternal Grandmother        diagnosed age 48-44  . Rheum arthritis Father   . Cancer Paternal Grandfather     GYNECOLOGIC HISTORY:  No LMP recorded. GX P2. She and her husband have 4 frozen embryos available in case they decide to undergo another pregnancy  SOCIAL HISTORY:  Leah Davis works as a Copywriter, advertising. Her husband Leah Davis works for    . The patient attends a local [employer]    ADVANCED DIRECTIVES:  Not in place   HEALTH MAINTENANCE: Social History  Substance Use Topics  . Smoking status: Never Smoker  . Smokeless tobacco: Never Used  . Alcohol use Yes     Comment: occassionally     Colonoscopy:  PAP:  Bone density:  Lipid panel:  No Known Allergies  Current Outpatient Prescriptions  Medication Sig Dispense Refill  . FLUoxetine (PROZAC) 20 MG capsule Take 1 capsule (20 mg total) by mouth daily. 30 capsule 3  . labetalol (NORMODYNE) 200 MG tablet Take 1.5 tablets (300 mg total) by mouth 3 (three) times daily. 90 tablet 2  . NIFEdipine (PROCARDIA-XL/ADALAT CC) 60 MG 24 hr tablet Take 1 tablet (60 mg total) by mouth daily. 30 tablet 1  . Prenatal Vit-Fe Fumarate-FA (PRENATAL MULTIVITAMIN) TABS tablet Take 1 tablet by mouth daily at 12 noon.    . RaNITidine HCl (ZANTAC PO) Take 1 tablet by  mouth daily.      No current facility-administered medications for this visit.     OBJECTIVE: general white womanWho appears well  Vitals:   04/28/17 1045  BP: (!) 154/80  Pulse: 66  Resp: 18  Temp: 98.4 F (36.9 C)     Body mass index is 28.08 kg/m.    ECOG FS:0 - Asymptomatic  Sclerae unicteric, EOMs intact Oropharynx clear and moist No cervical or supraclavicular adenopathy Lungs no rales or rhonchi Heart regular rate and rhythm Abd soft, nontender, positive bowel sounds MSK no focal spinal tenderness, no upper  extremity lymphedema Neuro: nonfocal, well oriented, appropriate affect Breasts: No suspicious masses in either breast. No skin or nipple changes of concern. Both axillae are benign.  LAB RESULTS:  CMP     Component Value Date/Time   NA 137 03/15/2017 0526   NA 139 05/13/2016 0927   K 4.3 03/15/2017 0526   K 4.8 05/13/2016 0927   CL 107 03/15/2017 0526   CL 107 12/16/2012 1449   CO2 21 (L) 03/15/2017 0526   CO2 25 05/13/2016 0927   GLUCOSE 77 03/15/2017 0526   GLUCOSE 86 05/13/2016 0927   GLUCOSE 121 (H) 12/16/2012 1449   BUN 18 03/15/2017 0526   BUN 9.0 05/13/2016 0927   CREATININE 0.63 03/15/2017 0526   CREATININE 0.9 05/13/2016 0927   CALCIUM 8.8 (L) 03/15/2017 0526   CALCIUM 9.6 05/13/2016 0927   PROT 6.8 03/15/2017 0526   PROT 7.6 05/13/2016 0927   ALBUMIN 3.2 (L) 03/15/2017 0526   ALBUMIN 4.4 05/13/2016 0927   AST 24 03/15/2017 0526   AST 14 05/13/2016 0927   ALT 52 03/15/2017 0526   ALT 9 05/13/2016 0927   ALKPHOS 130 (H) 03/15/2017 0526   ALKPHOS 40 05/13/2016 0927   BILITOT 0.5 03/15/2017 0526   BILITOT 0.51 05/13/2016 0927   GFRNONAA >60 03/15/2017 0526   GFRAA >60 03/15/2017 0526    INo results found for: SPEP, UPEP  Lab Results  Component Value Date   WBC 6.1 04/28/2017   NEUTROABS 3.2 04/28/2017   HGB 12.2 04/28/2017   HCT 37.4 04/28/2017   MCV 89.0 04/28/2017   PLT 221 04/28/2017      Chemistry      Component Value Date/Time   NA 137 03/15/2017 0526   NA 139 05/13/2016 0927   K 4.3 03/15/2017 0526   K 4.8 05/13/2016 0927   CL 107 03/15/2017 0526   CL 107 12/16/2012 1449   CO2 21 (L) 03/15/2017 0526   CO2 25 05/13/2016 0927   BUN 18 03/15/2017 0526   BUN 9.0 05/13/2016 0927   CREATININE 0.63 03/15/2017 0526   CREATININE 0.9 05/13/2016 0927      Component Value Date/Time   CALCIUM 8.8 (L) 03/15/2017 0526   CALCIUM 9.6 05/13/2016 0927   ALKPHOS 130 (H) 03/15/2017 0526   ALKPHOS 40 05/13/2016 0927   AST 24 03/15/2017 0526   AST 14  05/13/2016 0927   ALT 52 03/15/2017 0526   ALT 9 05/13/2016 0927   BILITOT 0.5 03/15/2017 0526   BILITOT 0.51 05/13/2016 0927       No results found for: LABCA2  No components found for: [id number]  No results for input(s): INR in the last 168 hours.  Urinalysis No results found for: COLORURINE, APPEARANCEUR, LABSPEC, PHURINE, GLUCOSEU, HGBUR, BILIRUBINUR, KETONESUR, PROTEINUR, UROBILINOGEN, NITRITE, LEUKOCYTESUR  STUDIES: No results found.  ASSESSMENT: 31 y.o.  BRCA1 mutation carrier  (1) breast density category C  (  2) intensified screening:  (a) yearly MRI alternating with mammogram/tomography recommended but insurance refusing coverage  (b) bi-annual TVUS day 1-10 of cycle with CA 125 starting age 31  (40) once done with family planning, to consider:  (a) OCPs for ovarian cancer risk reduction  (b) tamoxifen for breast cancer risk reduction  (c) consider bilateral mastectomies when done with nursing  (d) consider BSO after age 19  (62) severe preeclampsia with April 2018 delivery  PLAN: Lacora is recovering well from her recent postpartum problems and hopefully we will be able to get off blood pressure medication in the near future.  As far as breast cancer screening is concerned she will have mammography in June and I am setting her up for breast MRI in December. She easily will have met her deductible by then.  It is recommended also that she undergo twice yearly pelvic ultrasonography. I think this could be most easily performed through her gynecologist office and I will send him a note to see if he would be comfortable doing that.  Assuming that is the case, she will see me again in January, after her December MRI. I could see her yearly thereafter.  She knows to call for any problems that may develop before the next visit.  Chauncey Cruel, MD   04/28/2017 10:55 AM Medical Oncology and Hematology [facility] [address] [place], [place]  [address] Tel. [phone]    Fax. [phone]

## 2017-05-04 ENCOUNTER — Other Ambulatory Visit: Payer: Self-pay | Admitting: *Deleted

## 2017-05-05 ENCOUNTER — Other Ambulatory Visit: Payer: Self-pay

## 2017-05-05 DIAGNOSIS — Z1501 Genetic susceptibility to malignant neoplasm of breast: Secondary | ICD-10-CM

## 2017-05-05 DIAGNOSIS — Z1509 Genetic susceptibility to other malignant neoplasm: Principal | ICD-10-CM

## 2017-05-11 ENCOUNTER — Other Ambulatory Visit: Payer: BLUE CROSS/BLUE SHIELD

## 2017-05-11 ENCOUNTER — Telehealth: Payer: Self-pay

## 2017-05-11 ENCOUNTER — Ambulatory Visit: Payer: BLUE CROSS/BLUE SHIELD | Admitting: Oncology

## 2017-05-11 NOTE — Telephone Encounter (Signed)
Pt called about the timing of her mammogram and MRI. lvm per Dr Magrinat's note her MM is in June and her MRI in December.

## 2017-05-13 ENCOUNTER — Telehealth: Payer: Self-pay

## 2017-05-13 DIAGNOSIS — Z1509 Genetic susceptibility to other malignant neoplasm: Principal | ICD-10-CM

## 2017-05-13 DIAGNOSIS — Z1501 Genetic susceptibility to malignant neoplasm of breast: Secondary | ICD-10-CM

## 2017-05-13 NOTE — Telephone Encounter (Signed)
Pt called about switching her MRI breast to June and her MM to Dec d/t insurance issues. S/w Dr Darnelle CatalanMagrinat and this is OK.  Replaced Dec MRI order with new one for June. [place] [facility] [place] [facility] [place].  The MM order is good for 1 year.  lvm with pt about these changes and she needs to call the breast center to change her MM appt to December.

## 2017-05-18 ENCOUNTER — Telehealth: Payer: Self-pay

## 2017-05-18 NOTE — Telephone Encounter (Signed)
-----   Message from Pernell DupreNicole Singleton sent at 05/14/2017 11:11 AM EDT ----- Regarding: RE: PA for MRI breast MRI authorized.   Thanks  ----- Message ----- From: Gaylord ShihBuyck, Davius Goudeau S, RN Sent: 05/13/2017   4:57 PM To: Billey CoValerie P Dodd, RN, Pernell DupreNicole Singleton, # Subject: PA for MRI breast                              Please get PA for MRI breast due in June.

## 2017-05-18 NOTE — Telephone Encounter (Signed)
lvm for pt to call [facility] to have her breast MRI scheduled. Phone number given.

## 2017-06-17 ENCOUNTER — Telehealth: Payer: Self-pay

## 2017-06-17 NOTE — Telephone Encounter (Signed)
VM left for pt as f/u to inquire if she had gotten her breast MRI per previous notes.  Pt returned call stating she had a baby in April and that she would not be cleared for MRI til October.  Call returned, LVM  informing pt that she may go ahead and have her mammogram now and her MRI in Oct

## 2017-06-21 ENCOUNTER — Other Ambulatory Visit: Payer: Self-pay | Admitting: Oncology

## 2017-06-21 ENCOUNTER — Telehealth: Payer: Self-pay

## 2017-06-21 DIAGNOSIS — Z1231 Encounter for screening mammogram for malignant neoplasm of breast: Secondary | ICD-10-CM

## 2017-06-21 NOTE — Telephone Encounter (Signed)
LVM with pt informing her that it will be fine for her to have her MRI and mammogram in Oct and Dec

## 2017-06-21 NOTE — Telephone Encounter (Signed)
lvm with pt informing her Oct and Dec will be fine

## 2017-06-21 NOTE — Telephone Encounter (Signed)
Pt had baby 03/08/17. She called to scheduler her MM and was told they don't do MM for 6 months after having a baby. Nor do they do MRI until 6 month after having a baby. She wants to know what the plan will be. She wants an MRI in this year b/c she has met her deductible. She suggested MRI in Oct and MM in Dec as possibility?

## 2017-07-02 ENCOUNTER — Ambulatory Visit: Payer: BLUE CROSS/BLUE SHIELD

## 2017-07-06 ENCOUNTER — Other Ambulatory Visit: Payer: Self-pay | Admitting: Obstetrics and Gynecology

## 2017-08-20 DIAGNOSIS — O1414 Severe pre-eclampsia complicating childbirth: Secondary | ICD-10-CM | POA: Diagnosis not present

## 2017-09-03 DIAGNOSIS — Z0000 Encounter for general adult medical examination without abnormal findings: Secondary | ICD-10-CM | POA: Diagnosis not present

## 2017-09-03 DIAGNOSIS — Z1329 Encounter for screening for other suspected endocrine disorder: Secondary | ICD-10-CM | POA: Diagnosis not present

## 2017-09-03 DIAGNOSIS — Z130 Encounter for screening for diseases of the blood and blood-forming organs and certain disorders involving the immune mechanism: Secondary | ICD-10-CM | POA: Diagnosis not present

## 2017-09-03 DIAGNOSIS — Z13220 Encounter for screening for lipoid disorders: Secondary | ICD-10-CM | POA: Diagnosis not present

## 2017-09-10 DIAGNOSIS — Z8759 Personal history of other complications of pregnancy, childbirth and the puerperium: Secondary | ICD-10-CM | POA: Diagnosis not present

## 2017-09-10 DIAGNOSIS — O1414 Severe pre-eclampsia complicating childbirth: Secondary | ICD-10-CM | POA: Diagnosis not present

## 2017-09-17 DIAGNOSIS — G43109 Migraine with aura, not intractable, without status migrainosus: Secondary | ICD-10-CM | POA: Diagnosis not present

## 2017-11-05 ENCOUNTER — Ambulatory Visit
Admission: RE | Admit: 2017-11-05 | Discharge: 2017-11-05 | Disposition: A | Discharge status: Home / Self Care | Payer: BLUE CROSS/BLUE SHIELD | Source: Ambulatory Visit | Attending: Oncology | Admitting: Oncology

## 2017-11-05 DIAGNOSIS — Z1509 Genetic susceptibility to other malignant neoplasm: Principal | ICD-10-CM

## 2017-11-05 DIAGNOSIS — Z1501 Genetic susceptibility to malignant neoplasm of breast: Secondary | ICD-10-CM

## 2017-11-05 DIAGNOSIS — Z1231 Encounter for screening mammogram for malignant neoplasm of breast: Secondary | ICD-10-CM | POA: Diagnosis not present

## 2017-11-24 ENCOUNTER — Ambulatory Visit
Admission: RE | Admit: 2017-11-24 | Discharge: 2017-11-24 | Disposition: A | Discharge status: Home / Self Care | Payer: BLUE CROSS/BLUE SHIELD | Source: Ambulatory Visit | Attending: Oncology | Admitting: Oncology

## 2017-11-24 DIAGNOSIS — Z1509 Genetic susceptibility to other malignant neoplasm: Principal | ICD-10-CM

## 2017-11-24 DIAGNOSIS — N6489 Other specified disorders of breast: Secondary | ICD-10-CM | POA: Diagnosis not present

## 2017-11-24 DIAGNOSIS — Z1501 Genetic susceptibility to malignant neoplasm of breast: Secondary | ICD-10-CM

## 2017-11-24 MED ORDER — GADOBENATE DIMEGLUMINE 529 MG/ML IV SOLN
15.0000 mL | Freq: Once | INTRAVENOUS | Status: AC | PRN
  Administered 2017-11-24: 15 mL via INTRAVENOUS

## 2017-11-26 ENCOUNTER — Other Ambulatory Visit: Payer: Self-pay | Admitting: Oncology

## 2017-11-26 NOTE — Progress Notes (Unsigned)
I called Leah Davis and gave her the results of her breast MRI which show 2 very small spots in the right breast.  This was done almost 2 weeks after her period and these are going to be related to estrogen changes.  Nevertheless to make sure I would like to obtain ultrasound of the right breast, right after her period.  The problem is that her periods have been a little bit more irregular since she had her baby.  She is going to call me in January on day 2 or day 3 of her period and I will set her up for the ultrasound a few days after that.  She will then return to see me with labs to discuss the ultrasound results.  She has a good understanding of all this and is in agreement with the plan.

## 2017-12-01 ENCOUNTER — Other Ambulatory Visit: Payer: BLUE CROSS/BLUE SHIELD

## 2017-12-08 ENCOUNTER — Ambulatory Visit: Payer: BLUE CROSS/BLUE SHIELD | Admitting: Oncology

## 2017-12-10 DIAGNOSIS — Z8759 Personal history of other complications of pregnancy, childbirth and the puerperium: Secondary | ICD-10-CM | POA: Diagnosis not present

## 2017-12-13 ENCOUNTER — Other Ambulatory Visit: Payer: Self-pay | Admitting: *Deleted

## 2017-12-13 ENCOUNTER — Telehealth: Payer: Self-pay | Admitting: *Deleted

## 2017-12-13 ENCOUNTER — Other Ambulatory Visit: Payer: Self-pay | Admitting: Oncology

## 2017-12-13 DIAGNOSIS — R9389 Abnormal findings on diagnostic imaging of other specified body structures: Secondary | ICD-10-CM

## 2017-12-13 NOTE — Telephone Encounter (Signed)
This RN received VM from pt stating she started her menses on 12/11/2017- " and I was told to call so I could get an U/S to follow up with my abnormal MRI "  This RN contacted the [facility] - and obtained the next available appointment ( post scheduler reviewing with techs ) for 12/15/2017 at 1020 - pt to arrive at 10am.  Above reviewed with MD and called to pt.

## 2017-12-15 ENCOUNTER — Ambulatory Visit
Admission: RE | Admit: 2017-12-15 | Discharge: 2017-12-15 | Disposition: A | Discharge status: Home / Self Care | Payer: BLUE CROSS/BLUE SHIELD | Source: Ambulatory Visit | Attending: Oncology | Admitting: Oncology

## 2017-12-15 ENCOUNTER — Other Ambulatory Visit: Payer: Self-pay | Admitting: Oncology

## 2017-12-15 DIAGNOSIS — R9389 Abnormal findings on diagnostic imaging of other specified body structures: Secondary | ICD-10-CM

## 2017-12-15 DIAGNOSIS — N6489 Other specified disorders of breast: Secondary | ICD-10-CM | POA: Diagnosis not present

## 2017-12-15 DIAGNOSIS — Z1509 Genetic susceptibility to other malignant neoplasm: Principal | ICD-10-CM

## 2017-12-15 DIAGNOSIS — Z1501 Genetic susceptibility to malignant neoplasm of breast: Secondary | ICD-10-CM

## 2017-12-16 ENCOUNTER — Other Ambulatory Visit: Payer: Self-pay | Admitting: Oncology

## 2017-12-16 DIAGNOSIS — Z1501 Genetic susceptibility to malignant neoplasm of breast: Secondary | ICD-10-CM

## 2017-12-16 DIAGNOSIS — Z1509 Genetic susceptibility to other malignant neoplasm: Principal | ICD-10-CM

## 2017-12-16 NOTE — Progress Notes (Signed)
I called Leah Davis with results of her ultrasound which were negative.  I explained that lesions less than a centimeter frequently are not well seen on ultrasonography.  She will be scheduled for an MRI guided biopsy of the area in question

## 2017-12-24 ENCOUNTER — Other Ambulatory Visit: Payer: Self-pay | Admitting: Oncology

## 2017-12-24 ENCOUNTER — Ambulatory Visit
Admission: RE | Admit: 2017-12-24 | Discharge: 2017-12-24 | Disposition: A | Discharge status: Home / Self Care | Payer: BLUE CROSS/BLUE SHIELD | Source: Ambulatory Visit | Attending: Oncology | Admitting: Oncology

## 2017-12-24 DIAGNOSIS — Z1509 Genetic susceptibility to other malignant neoplasm: Principal | ICD-10-CM

## 2017-12-24 DIAGNOSIS — Z1501 Genetic susceptibility to malignant neoplasm of breast: Secondary | ICD-10-CM

## 2017-12-30 ENCOUNTER — Ambulatory Visit
Admission: RE | Admit: 2017-12-30 | Discharge: 2017-12-30 | Disposition: A | Discharge status: Home / Self Care | Payer: BLUE CROSS/BLUE SHIELD | Source: Ambulatory Visit | Attending: Oncology | Admitting: Oncology

## 2017-12-30 ENCOUNTER — Ambulatory Visit: Admission: RE | Admit: 2017-12-30 | Payer: BLUE CROSS/BLUE SHIELD | Source: Ambulatory Visit

## 2017-12-30 ENCOUNTER — Other Ambulatory Visit: Payer: Self-pay | Admitting: Oncology

## 2017-12-30 ENCOUNTER — Other Ambulatory Visit (HOSPITAL_COMMUNITY): Payer: Self-pay | Admitting: Diagnostic Radiology

## 2017-12-30 DIAGNOSIS — N63 Unspecified lump in unspecified breast: Secondary | ICD-10-CM

## 2017-12-30 DIAGNOSIS — N6011 Diffuse cystic mastopathy of right breast: Secondary | ICD-10-CM | POA: Diagnosis not present

## 2017-12-30 DIAGNOSIS — Z1501 Genetic susceptibility to malignant neoplasm of breast: Secondary | ICD-10-CM

## 2017-12-30 DIAGNOSIS — N6489 Other specified disorders of breast: Secondary | ICD-10-CM | POA: Diagnosis not present

## 2017-12-30 DIAGNOSIS — Z1509 Genetic susceptibility to other malignant neoplasm: Principal | ICD-10-CM

## 2017-12-30 HISTORY — PX: BREAST BIOPSY: SHX20

## 2017-12-30 MED ORDER — GADOBENATE DIMEGLUMINE 529 MG/ML IV SOLN
15.0000 mL | Freq: Once | INTRAVENOUS | Status: AC | PRN
  Administered 2017-12-30: 15 mL via INTRAVENOUS

## 2018-04-08 DIAGNOSIS — Z01419 Encounter for gynecological examination (general) (routine) without abnormal findings: Secondary | ICD-10-CM | POA: Diagnosis not present

## 2018-04-08 DIAGNOSIS — Z13220 Encounter for screening for lipoid disorders: Secondary | ICD-10-CM | POA: Diagnosis not present

## 2018-04-08 DIAGNOSIS — Z1329 Encounter for screening for other suspected endocrine disorder: Secondary | ICD-10-CM | POA: Diagnosis not present

## 2018-04-08 DIAGNOSIS — Z0000 Encounter for general adult medical examination without abnormal findings: Secondary | ICD-10-CM | POA: Diagnosis not present

## 2018-04-08 DIAGNOSIS — Z6825 Body mass index (BMI) 25.0-25.9, adult: Secondary | ICD-10-CM | POA: Diagnosis not present

## 2018-05-05 DIAGNOSIS — Z8759 Personal history of other complications of pregnancy, childbirth and the puerperium: Secondary | ICD-10-CM | POA: Diagnosis not present

## 2018-07-28 ENCOUNTER — Telehealth: Payer: Self-pay | Admitting: Oncology

## 2018-07-28 ENCOUNTER — Other Ambulatory Visit: Payer: Self-pay | Admitting: *Deleted

## 2018-07-28 NOTE — Telephone Encounter (Signed)
Scheduled appt per 8/28 sch message - left message with appt date and time.

## 2018-08-18 ENCOUNTER — Other Ambulatory Visit: Payer: Self-pay | Admitting: *Deleted

## 2018-08-18 DIAGNOSIS — Z1509 Genetic susceptibility to other malignant neoplasm: Principal | ICD-10-CM

## 2018-08-18 DIAGNOSIS — Z1501 Genetic susceptibility to malignant neoplasm of breast: Secondary | ICD-10-CM

## 2018-08-18 DIAGNOSIS — Z1231 Encounter for screening mammogram for malignant neoplasm of breast: Secondary | ICD-10-CM

## 2018-09-09 ENCOUNTER — Ambulatory Visit
Admission: RE | Admit: 2018-09-09 | Discharge: 2018-09-09 | Disposition: A | Discharge status: Home / Self Care | Payer: BLUE CROSS/BLUE SHIELD | Source: Ambulatory Visit | Attending: Oncology | Admitting: Oncology

## 2018-09-09 ENCOUNTER — Other Ambulatory Visit: Payer: Self-pay | Admitting: Interventional Cardiology

## 2018-09-09 DIAGNOSIS — Z1231 Encounter for screening mammogram for malignant neoplasm of breast: Secondary | ICD-10-CM

## 2018-09-09 DIAGNOSIS — Z1509 Genetic susceptibility to other malignant neoplasm: Secondary | ICD-10-CM

## 2018-09-09 DIAGNOSIS — Z1501 Genetic susceptibility to malignant neoplasm of breast: Secondary | ICD-10-CM

## 2018-10-07 ENCOUNTER — Ambulatory Visit
Admission: RE | Admit: 2018-10-07 | Discharge: 2018-10-07 | Disposition: A | Discharge status: Home / Self Care | Payer: BLUE CROSS/BLUE SHIELD | Source: Ambulatory Visit | Attending: Oncology | Admitting: Oncology

## 2018-10-07 ENCOUNTER — Other Ambulatory Visit: Payer: BLUE CROSS/BLUE SHIELD

## 2018-10-07 DIAGNOSIS — Z1231 Encounter for screening mammogram for malignant neoplasm of breast: Secondary | ICD-10-CM

## 2018-10-07 MED ORDER — GADOBUTROL 1 MMOL/ML IV SOLN
7.0000 mL | Freq: Once | INTRAVENOUS | Status: AC | PRN
  Administered 2018-10-07: 7 mL via INTRAVENOUS

## 2018-12-27 NOTE — Progress Notes (Signed)
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 This is one of the common Ashkenazi mutations.   INTERVAL HISTORY: Leah Davis returns today for follow-up and treatment of her BRCA1 screening. She is accompanied by her husband.  She continues with intensified screenings.  Since her last visit here, she underwent a digital screening bilateral mammogram with tomography on 09/09/2018 showing Breast Density Category C. There is no mammographic evidence of malignancy.  She also underwent a bilateral breast MRI with and without contrast on 10/07/2018 showing Breast Density Category C. There is no MRI evidence of malignancy.   She has not completed transvaginal ultrasound.    REVIEW OF SYSTEMS: Leah Davis has been exercising, usually 5 or 6 times per week at [employer]. She has also changed her diet to support healthy habits. Her periods have been regular, but they are very heavy for the first few days. They last around between 5 and 7 days and occur between 26 and 28 days. The patient denies unusual headaches, visual changes, nausea, vomiting, or dizziness. There has been no unusual cough, phlegm production, or pleurisy. This been no change in bowel or bladder  habits. The patient denies unexplained fatigue or unexplained weight loss, bleeding, rash, or fever. A detailed review of systems was otherwise noncontributory.    PAST MEDICAL HISTORY: Past Medical History:  Diagnosis Date  . Anxiety   . BRCA1 positive 2014  . Condyloma acuminata 05/2012   see 06/17/2012 note.  . Migraine    WITHOUT AURA  . PMS (premenstrual syndrome)   . Postpartum care following vaginal delivery (7/25) 06/23/2014  . Pregnancy 02/24/2014    PAST SURGICAL HISTORY: Past Surgical History:  Procedure Laterality Date  . BREAST BIOPSY Right   . ivf    . WISDOM TOOTH EXTRACTION      FAMILY HISTORY Family History  Problem Relation Age of Onset  . Breast cancer Mother 38  . Breast cancer Maternal Grandmother        diagnosed age 35-44  . Rheum arthritis Father   . Cancer Paternal Grandfather     GYNECOLOGIC HISTORY:  No LMP recorded. GX P2. She and her husband have 4 frozen embryos available in case they decide to undergo another pregnancy  SOCIAL HISTORY:  Leah Davis works as a Copywriter, advertising. Her husband Leah Davis works for    . The patient attends a local [employer]    ADVANCED DIRECTIVES:  Not in place   HEALTH MAINTENANCE: Social History   Tobacco Use  . Smoking status: Never Smoker  . Smokeless tobacco: Never Used  Substance Use Topics  . Alcohol use: Yes    Comment: occassionally  . Drug use: No     Colonoscopy:  PAP:  Bone density:  Lipid panel:  No Known Allergies  Current Outpatient Medications  Medication Sig Dispense  Refill  . FLUoxetine (PROZAC) 20 MG capsule Take 1 capsule (20 mg total) by mouth daily. 30 capsule 3  . labetalol (NORMODYNE) 200 MG tablet Take 1.5 tablets (300 mg total) by mouth 3 (three) times daily. 90 tablet 2  . NIFEdipine (PROCARDIA-XL/ADALAT CC) 60 MG 24 hr tablet Take 1 tablet (60 mg total) by mouth daily. 30 tablet 1  . Prenatal Vit-Fe Fumarate-FA (PRENATAL MULTIVITAMIN) TABS tablet Take 1 tablet by mouth  daily at 12 noon.    . RaNITidine HCl (ZANTAC PO) Take 1 tablet by mouth daily.      No current facility-administered medications for this visit.     OBJECTIVE: general white woman no acute distress  Vitals:   12/28/18 1415  BP: (!) 180/87  Pulse: (!) 107  Resp: 20  Temp: 98.5 F (36.9 C)  SpO2: 100%     Body mass index is 25.03 kg/m.    ECOG FS:0 - Asymptomatic  Sclerae unicteric, pupils round and equal Oropharynx clear and moist No cervical or supraclavicular adenopathy Lungs no rales or rhonchi Heart regular rate and rhythm Abd soft, nontender, positive bowel sounds MSK no focal spinal tenderness, no upper extremity lymphedema Neuro: nonfocal, well oriented, appropriate affect Breasts: I do not palpate a mass in either breast; there are no skin or nipple changes of concern; both axillae are benign.  LAB RESULTS:  CMP     Component Value Date/Time   NA 138 04/28/2017 1022   K 4.7 04/28/2017 1022   CL 107 03/15/2017 0526   CL 107 12/16/2012 1449   CO2 25 04/28/2017 1022   GLUCOSE 81 04/28/2017 1022   GLUCOSE 121 (H) 12/16/2012 1449   BUN 13.7 04/28/2017 1022   CREATININE 0.8 04/28/2017 1022   CALCIUM 9.4 04/28/2017 1022   PROT 6.9 04/28/2017 1022   ALBUMIN 4.3 04/28/2017 1022   AST 16 04/28/2017 1022   ALT 19 04/28/2017 1022   ALKPHOS 78 04/28/2017 1022   BILITOT 0.40 04/28/2017 1022   GFRNONAA >60 03/15/2017 0526   GFRAA >60 03/15/2017 0526    INo results found for: SPEP, UPEP  Lab Results  Component Value Date   WBC 6.1 04/28/2017   NEUTROABS 3.2 04/28/2017   HGB 12.2 04/28/2017   HCT 37.4 04/28/2017   MCV 89.0 04/28/2017   PLT 221 04/28/2017      Chemistry      Component Value Date/Time   NA 138 04/28/2017 1022   K 4.7 04/28/2017 1022   CL 107 03/15/2017 0526   CL 107 12/16/2012 1449   CO2 25 04/28/2017 1022   BUN 13.7 04/28/2017 1022   CREATININE 0.8 04/28/2017 1022      Component Value Date/Time   CALCIUM 9.4 04/28/2017 1022    ALKPHOS 78 04/28/2017 1022   AST 16 04/28/2017 1022   ALT 19 04/28/2017 1022   BILITOT 0.40 04/28/2017 1022       No results found for: LABCA2  No components found for: LABCA125  No results for input(s): INR in the last 168 hours.  Urinalysis No results found for: COLORURINE, APPEARANCEUR, LABSPEC, PHURINE, GLUCOSEU, HGBUR, BILIRUBINUR, KETONESUR, PROTEINUR, UROBILINOGEN, NITRITE, LEUKOCYTESUR   STUDIES: No results found.   ASSESSMENT: 33 y.o.  BRCA1 mutation carrier  (1) breast density category C  (2) intensified screening:  (a) yearly MRI alternating with mammogram/tomography recommended but insurance refusing coverage  (b) consider bi-annual TVUS day 1-10 of cycle starting age 32  (44) once done with family planning, to consider:  (  a) OCPs for ovarian cancer risk reduction  (b) tamoxifen for breast cancer risk reduction  (c) consider bilateral mastectomies when done with nursing  (d) consider BSO after age 3  (71) severe preeclampsia with April 2018 delivery   PLAN: Leah Davis looks perfectly healthy and vigorous.  They have not finalized their decisions regarding family-planning so she is not ready for bilateral salpingo-oophorectomy at this point.  I do not know why her met MRI was done only a month after her mammogram last year.  Generally we like to set these up 6 months apart.  She will have mammography in May and her breast MRI in November.  I will then see her on a yearly basis in December  We discussed the fact that we do not know how to screen for ovarian cancer and she has a good understanding of the pathophysiology of that.  However she would be interested in obtaining transvaginal ultrasonography.  She will call me with her next period and I will set her up at the very end of the period which at least gives [place] an easily repeatable timeframe for future tests  She knows to call for any other issues that may develop before the next visit.  Leah Davis, Leah Dad,  MD  12/28/18 2:42 PM Medical Oncology and Hematology [facility] [address] [place], [place] [address] Tel. [phone]    Fax. [phone]  I, Leah Davis am acting as a Education administrator for Chauncey Cruel, MD.   I, Leah Del MD, have reviewed the above documentation for accuracy and completeness, and I agree with the above.

## 2018-12-28 ENCOUNTER — Telehealth: Payer: Self-pay | Admitting: Oncology

## 2018-12-28 ENCOUNTER — Inpatient Hospital Stay: Payer: BLUE CROSS/BLUE SHIELD | Attending: Oncology | Admitting: Oncology

## 2018-12-28 VITALS — BP 180/87 | HR 107 | Temp 98.5°F | Resp 20 | Ht 66.0 in | Wt 155.1 lb

## 2018-12-28 DIAGNOSIS — Z803 Family history of malignant neoplasm of breast: Secondary | ICD-10-CM | POA: Insufficient documentation

## 2018-12-28 DIAGNOSIS — Z79899 Other long term (current) drug therapy: Secondary | ICD-10-CM | POA: Insufficient documentation

## 2018-12-28 DIAGNOSIS — Z1509 Genetic susceptibility to other malignant neoplasm: Secondary | ICD-10-CM

## 2018-12-28 DIAGNOSIS — Z1501 Genetic susceptibility to malignant neoplasm of breast: Secondary | ICD-10-CM | POA: Diagnosis not present

## 2018-12-28 NOTE — Telephone Encounter (Signed)
Per 01/29 scheduled follow up in Dec. Patient declined print out

## 2019-01-17 ENCOUNTER — Other Ambulatory Visit: Payer: Self-pay | Admitting: *Deleted

## 2019-01-17 ENCOUNTER — Telehealth: Payer: Self-pay | Admitting: *Deleted

## 2019-01-17 NOTE — Telephone Encounter (Signed)
This RN returned VM left by pt stating she is being called to schedule an MRI " but I just had one late 2019?"  Per chart review noted MD ordered mammo for May 2020 and MRI in November- ideally pt should have breast mammo alternating with MRI every 6 months.  This RN returned pt's call and obtained identified VM- message left per above.

## 2019-09-11 ENCOUNTER — Other Ambulatory Visit: Payer: Self-pay | Admitting: Oncology

## 2019-09-11 DIAGNOSIS — Z1231 Encounter for screening mammogram for malignant neoplasm of breast: Secondary | ICD-10-CM

## 2019-09-19 ENCOUNTER — Telehealth: Payer: Self-pay | Admitting: Oncology

## 2019-09-19 NOTE — Telephone Encounter (Signed)
Scheduled appt per 10/16 sch message - unable to reach pt . Left message with appt date and time   

## 2019-11-01 ENCOUNTER — Ambulatory Visit: Payer: BLUE CROSS/BLUE SHIELD | Admitting: Oncology

## 2019-11-10 ENCOUNTER — Other Ambulatory Visit: Payer: Self-pay

## 2019-11-10 ENCOUNTER — Ambulatory Visit
Admission: RE | Admit: 2019-11-10 | Discharge: 2019-11-10 | Disposition: A | Discharge status: Home / Self Care | Payer: BC Managed Care – PPO | Source: Ambulatory Visit | Attending: Oncology | Admitting: Oncology

## 2019-11-10 DIAGNOSIS — Z1231 Encounter for screening mammogram for malignant neoplasm of breast: Secondary | ICD-10-CM

## 2019-11-13 ENCOUNTER — Inpatient Hospital Stay: Payer: BC Managed Care – PPO | Attending: Oncology | Admitting: Oncology

## 2019-11-13 ENCOUNTER — Other Ambulatory Visit: Payer: Self-pay

## 2019-11-13 VITALS — BP 158/93 | HR 93 | Temp 97.4°F | Resp 16 | Ht 66.0 in | Wt 158.5 lb

## 2019-11-13 DIAGNOSIS — Z803 Family history of malignant neoplasm of breast: Secondary | ICD-10-CM | POA: Diagnosis not present

## 2019-11-13 DIAGNOSIS — Z9189 Other specified personal risk factors, not elsewhere classified: Secondary | ICD-10-CM

## 2019-11-13 DIAGNOSIS — Z1239 Encounter for other screening for malignant neoplasm of breast: Secondary | ICD-10-CM | POA: Diagnosis not present

## 2019-11-13 DIAGNOSIS — Z1501 Genetic susceptibility to malignant neoplasm of breast: Secondary | ICD-10-CM | POA: Diagnosis not present

## 2019-11-13 DIAGNOSIS — F419 Anxiety disorder, unspecified: Secondary | ICD-10-CM | POA: Diagnosis not present

## 2019-11-13 DIAGNOSIS — Z1509 Genetic susceptibility to other malignant neoplasm: Secondary | ICD-10-CM | POA: Diagnosis not present

## 2019-11-13 DIAGNOSIS — Z79899 Other long term (current) drug therapy: Secondary | ICD-10-CM | POA: Diagnosis not present

## 2019-11-13 NOTE — Progress Notes (Signed)
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  She also follows closely with Dr. Corky Sing in gynecology  Since her last visit, she underwent bilateral diagnostic mammography with tomography at [place] on 11/10/2019 showing: breast density category B; no evidence of malignancy in either breast.    REVIEW OF SYSTEMS: Tahra is not going to the gym so she is exercising less than she used to.  She does some walking and she has a spin bike.  They just got a 68-monthold puppy so that is also keeping the family busy.  She has had no visual changes cough phlegm production pleurisy shortness of breath or change in bowel or bladder habits.  She is still having regular periods.  They have not finalized family-planning: That is discussed further below.  A detailed review of systems today was otherwise noncontributory   FAMILY BREAST/OVARIAN CANCER HISTORY: The patient's mother was diagnosed with breast cancer at age 33 and her maternal grandmother also with breast cancer around age 33 The patient's mother and this is my patient CGaylyn Lambert) NDarraherself was tested in September 2013 and was found to carry the familial mutation,in BRCA1, namely [id number].. This is one of the common Ashkenazi mutations.   PAST MEDICAL HISTORY: Past Medical History:  Diagnosis Date  . Anxiety   . BRCA1 positive 2014  . Condyloma acuminata 05/2012   see 06/17/2012 note.  . Migraine    WITHOUT AURA  . PMS (premenstrual  syndrome)   . Postpartum care following vaginal delivery (7/25) 06/23/2014  . Pregnancy 02/24/2014    PAST SURGICAL HISTORY: Past Surgical History:  Procedure Laterality Date  . BREAST BIOPSY Right   . ivf    . WISDOM TOOTH EXTRACTION      FAMILY HISTORY Family History  Problem Relation Age of Onset  . Breast cancer Mother 468 . Breast cancer Maternal Grandmother        diagnosed age 33-44 . Rheum arthritis Father   . Cancer Paternal Grandfather     GYNECOLOGIC HISTORY:  No LMP recorded. GX P2.  As of December 2020 NAevais still having regular periods.  She and her husband have 4 frozen embryos available in case they decide to pursue another pregnancy.  Recall she did have significant preeclampsia with pregnancies previously.  Currently they are not using any contraception.  If she becomes pregnant they may release the embryos.  At this point though we have not made a definitive decision.   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  The patient attends a local [employer]   ADVANCED DIRECTIVES:  Not in place   HEALTH MAINTENANCE: Social History   Tobacco Use  . Smoking status: Never Smoker  . Smokeless tobacco: Never Used  Substance Use Topics  . Alcohol use: Yes    Comment: occassionally  . Drug use: No     Colonoscopy: n/a  PAP:  Bone  density: n/a  Lipid panel:  No Known Allergies  Current Outpatient Medications  Medication Sig Dispense Refill  . FLUoxetine (PROZAC) 20 MG capsule Take 1 capsule (20 mg total) by mouth daily. 30 capsule 3  . labetalol (NORMODYNE) 200 MG tablet Take 1.5 tablets (300 mg total) by mouth 3 (three) times daily. 90 tablet 2  . NIFEdipine (PROCARDIA-XL/ADALAT CC) 60 MG 24 hr tablet Take 1 tablet (60 mg total) by mouth daily. 30 tablet 1  . Prenatal Vit-Fe Fumarate-FA (PRENATAL MULTIVITAMIN) TABS tablet  Take 1 tablet by mouth daily at 12 noon.    . RaNITidine HCl (ZANTAC PO) Take 1 tablet by mouth daily.      No current facility-administered medications for this visit.    OBJECTIVE: Young white woman who appears well  Vitals:   11/13/19 1023  BP: (!) 158/93  Pulse: 93  Resp: 16  Temp: (!) 97.4 F (36.3 C)  SpO2: 100%     Body mass index is 25.58 kg/m.    ECOG FS:0 - Asymptomatic  Sclerae unicteric, EOMs intact Wearing a mask No cervical or supraclavicular adenopathy Lungs no rales or rhonchi Heart regular rate and rhythm Abd soft, nontender, positive bowel sounds MSK no focal spinal tenderness, no upper extremity lymphedema Neuro: nonfocal, well oriented, appropriate affect Breasts: I do not palpate a mass in either breast.  Both axillae are benign.   LAB RESULTS:  CMP     Component Value Date/Time   NA 138 04/28/2017 1022   K 4.7 04/28/2017 1022   CL 107 03/15/2017 0526   CL 107 12/16/2012 1449   CO2 25 04/28/2017 1022   GLUCOSE 81 04/28/2017 1022   GLUCOSE 121 (H) 12/16/2012 1449   BUN 13.7 04/28/2017 1022   CREATININE 0.8 04/28/2017 1022   CALCIUM 9.4 04/28/2017 1022   PROT 6.9 04/28/2017 1022   ALBUMIN 4.3 04/28/2017 1022   AST 16 04/28/2017 1022   ALT 19 04/28/2017 1022   ALKPHOS 78 04/28/2017 1022   BILITOT 0.40 04/28/2017 1022   GFRNONAA >60 03/15/2017 0526   GFRAA >60 03/15/2017 0526    INo results found for: SPEP, UPEP  Lab Results  Component Value Date   WBC 6.1 04/28/2017   NEUTROABS 3.2 04/28/2017   HGB 12.2 04/28/2017   HCT 37.4 04/28/2017   MCV 89.0 04/28/2017   PLT 221 04/28/2017      Chemistry      Component Value Date/Time   NA 138 04/28/2017 1022   K 4.7 04/28/2017 1022   CL 107 03/15/2017 0526   CL 107 12/16/2012 1449   CO2 25 04/28/2017 1022   BUN 13.7 04/28/2017 1022   CREATININE 0.8 04/28/2017 1022      Component Value Date/Time   CALCIUM 9.4 04/28/2017 1022   ALKPHOS 78 04/28/2017 1022   AST 16 04/28/2017 1022    ALT 19 04/28/2017 1022   BILITOT 0.40 04/28/2017 1022       No results found for: LABCA2  No components found for: LABCA125  No results for input(s): INR in the last 168 hours.  Urinalysis No results found for: COLORURINE, APPEARANCEUR, LABSPEC, PHURINE, GLUCOSEU, HGBUR, BILIRUBINUR, KETONESUR, PROTEINUR, UROBILINOGEN, NITRITE, LEUKOCYTESUR   STUDIES: MM 3D SCREEN BREAST BILATERAL  Result Date: 11/10/2019 CLINICAL DATA:  Screening. 33 year old female with BRCA 1 mutation. EXAM: DIGITAL SCREENING BILATERAL MAMMOGRAM WITH TOMO AND CAD COMPARISON:  Previous exam(s). ACR Breast Density Category b: There are scattered areas of fibroglandular density. FINDINGS: There are no findings suspicious  for malignancy. Images were processed with CAD. IMPRESSION: No mammographic evidence of malignancy. A result letter of this screening mammogram will be mailed directly to the patient. RECOMMENDATION: Bilateral screening mammogram in 1 year. Continue bilateral annual screening breast MRI BI-RADS CATEGORY  1: Negative. Electronically Signed   By: Margarette Canada M.D.   On: 11/10/2019 16:34     ASSESSMENT: 33 y.o.  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  (2) intensified screening:  (a) yearly MRI alternating with mammogram/tomography is standard of care  (b) consider bi-annual TVUS day 1-10 of cycle starting age 73  (86) once done with family planning, to consider:  (a) OCPs for ovarian cancer risk reduction  (b) tamoxifen for breast cancer risk reduction  (c) consider bilateral mastectomies when done with nursing  (d) consider BSO after age 4  (70) severe preeclampsia with April 2018 delivery   PLAN: Evony looks fine and she has normal activity and a normal family life.  As noted above they have still not made definitive decisions regarding family planning and therefore we cannot put in place the risk  reduction strategies listed above.  What we can do now though since she has better insurance is to make sure she has appropriate screening so I am setting her up for breast MRI 6 months from now.  She will then have mammography again 12 months from now and she will see me at that time.  At some point she will proceed to bilateral mastectomies.  Until then we will continue intensified screening.  Once she is done with family-planning she can also consider either tamoxifen or oral contraceptives to decrease the risk of breast or ovarian cancer respectively.   She knows to call for any other issue that may develop before the next visit.    Donyel Castagnola, Virgie Dad, MD  11/13/19 10:57 AM Medical Oncology and Hematology [facility] [place], [place] [address] Tel. [phone]    Fax. [phone]   I, Wilburn Mylar, am acting as scribe for Dr. Virgie Dad. Tyeler Goedken.  I, Lurline Del MD, have reviewed the above documentation for accuracy and completeness, and I agree with the above.

## 2019-11-14 ENCOUNTER — Telehealth: Payer: Self-pay | Admitting: Oncology

## 2019-11-14 NOTE — Telephone Encounter (Signed)
I left a message regarding schedule  

## 2020-07-15 ENCOUNTER — Other Ambulatory Visit: Payer: BC Managed Care – PPO

## 2020-09-06 ENCOUNTER — Other Ambulatory Visit: Payer: Self-pay | Admitting: Oncology

## 2020-09-06 ENCOUNTER — Ambulatory Visit
Admission: RE | Admit: 2020-09-06 | Discharge: 2020-09-06 | Disposition: A | Discharge status: Home / Self Care | Payer: BC Managed Care – PPO | Source: Ambulatory Visit | Attending: Oncology | Admitting: Oncology

## 2020-09-06 DIAGNOSIS — Z1239 Encounter for other screening for malignant neoplasm of breast: Secondary | ICD-10-CM

## 2020-09-06 DIAGNOSIS — N6321 Unspecified lump in the left breast, upper outer quadrant: Secondary | ICD-10-CM | POA: Diagnosis not present

## 2020-09-06 DIAGNOSIS — Z9189 Other specified personal risk factors, not elsewhere classified: Secondary | ICD-10-CM

## 2020-09-06 DIAGNOSIS — Z1501 Genetic susceptibility to malignant neoplasm of breast: Secondary | ICD-10-CM

## 2020-09-06 MED ORDER — GADOBUTROL 1 MMOL/ML IV SOLN
7.0000 mL | Freq: Once | INTRAVENOUS | Status: AC | PRN
  Administered 2020-09-06: 7 mL via INTRAVENOUS

## 2020-09-06 NOTE — Progress Notes (Signed)
I called Leah Davis with the results of her MRI which show a change likely benign but which does need to be biopsied.  She is agreeable to proceed.

## 2020-09-09 ENCOUNTER — Telehealth: Payer: Self-pay

## 2020-09-09 NOTE — Telephone Encounter (Signed)
RN returned call, voicemail left for call back.  

## 2020-09-10 ENCOUNTER — Other Ambulatory Visit: Payer: BC Managed Care – PPO

## 2020-09-16 ENCOUNTER — Ambulatory Visit
Admission: RE | Admit: 2020-09-16 | Discharge: 2020-09-16 | Disposition: A | Discharge status: Home / Self Care | Payer: BC Managed Care – PPO | Source: Ambulatory Visit | Attending: Oncology | Admitting: Oncology

## 2020-09-16 ENCOUNTER — Other Ambulatory Visit (HOSPITAL_COMMUNITY): Payer: Self-pay | Admitting: Diagnostic Radiology

## 2020-09-16 ENCOUNTER — Other Ambulatory Visit: Payer: Self-pay

## 2020-09-16 DIAGNOSIS — R928 Other abnormal and inconclusive findings on diagnostic imaging of breast: Secondary | ICD-10-CM | POA: Diagnosis not present

## 2020-09-16 DIAGNOSIS — N62 Hypertrophy of breast: Secondary | ICD-10-CM | POA: Diagnosis not present

## 2020-09-16 DIAGNOSIS — Z1239 Encounter for other screening for malignant neoplasm of breast: Secondary | ICD-10-CM

## 2020-09-16 HISTORY — PX: BREAST BIOPSY: SHX20

## 2020-09-16 MED ORDER — GADOBUTROL 1 MMOL/ML IV SOLN
6.0000 mL | Freq: Once | INTRAVENOUS | Status: AC | PRN
  Administered 2020-09-16: 6 mL via INTRAVENOUS

## 2020-09-27 DIAGNOSIS — D1801 Hemangioma of skin and subcutaneous tissue: Secondary | ICD-10-CM | POA: Diagnosis not present

## 2020-09-27 DIAGNOSIS — D225 Melanocytic nevi of trunk: Secondary | ICD-10-CM | POA: Diagnosis not present

## 2020-09-27 DIAGNOSIS — D2230 Melanocytic nevi of unspecified part of face: Secondary | ICD-10-CM | POA: Diagnosis not present

## 2020-09-27 DIAGNOSIS — L814 Other melanin hyperpigmentation: Secondary | ICD-10-CM | POA: Diagnosis not present

## 2020-10-04 ENCOUNTER — Ambulatory Visit: Payer: Self-pay

## 2020-10-04 NOTE — Telephone Encounter (Signed)
Pt. Calling to [facility] for UC appointment. Instructed Ernesto Rutherford is primary care now. States she is seen at [facility] and will call her PCP.

## 2020-10-18 ENCOUNTER — Other Ambulatory Visit: Payer: Self-pay | Admitting: Oncology

## 2020-11-12 ENCOUNTER — Inpatient Hospital Stay: Payer: BC Managed Care – PPO | Admitting: Oncology

## 2020-12-03 ENCOUNTER — Inpatient Hospital Stay: Payer: BC Managed Care – PPO | Admitting: Oncology

## 2021-01-28 NOTE — Progress Notes (Signed)
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  She also follows closely with Dr. Taavon in gynecology  Since her last visit, she underwent breast MRI on 09/06/2020 showing: breast composition B; 1.3 cm mass in upper-outer posterior left breast with no enhancement; no evidence of malignancy in right breast or lymph nodes.  She proceeded to biopsy of the left breast area in question on 09/16/2020. 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   REVIEW OF SYSTEMS: Leah Davis is doing "fine".  She goes to the gym about 5 days a week.  Despite her 2 small children she sleeps through most of the night.  A detailed review of systems today was noncontributory   COVID 19 VACCINATION STATUS:    FAMILY BREAST/OVARIAN CANCER HISTORY: The patient's mother was diagnosed with breast cancer at age 42, and her maternal grandmother also with breast cancer around age 44. The patient's mother and this is my patient Leah P.) Leah Davis herself was tested in September 2013 and was found to carry the familial mutation,in BRCA1, namely [id number].. This is one of the common Ashkenazi mutations.   PAST MEDICAL HISTORY: Past Medical History:  Diagnosis Date  . Anxiety   . BRCA1 positive 2014  . Condyloma acuminata 05/2012   see 06/17/2012 note.  . Migraine    WITHOUT AURA  . PMS (premenstrual syndrome)   . Postpartum care following vaginal delivery (7/25) 06/23/2014  . Pregnancy  02/24/2014    PAST SURGICAL HISTORY: Past Surgical History:  Procedure Laterality Date  . BREAST BIOPSY Right   . ivf    . WISDOM TOOTH EXTRACTION      FAMILY HISTORY Family History  Problem Relation Age of Onset  . Breast cancer Mother 42  . Breast cancer Maternal Grandmother        diagnosed age 43-44  . Rheum arthritis Father   . Cancer Paternal Grandfather     GYNECOLOGIC HISTORY:  No LMP recorded. GX P2.  As of March 2022 Leah Davis is still having regular periods.  She and her husband have 4 frozen embryos available in case they decide to pursue another pregnancy.  Recall she did have significant preeclampsia with pregnancies previously.  Currently they are not using any contraception.  If she becomes pregnant they may release the embryos.  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 Her husband Mike works for an audiovisual company.  Their 2 children are 2 and 5 years old as of December 2020.  The patient attends a local [employer] church    ADVANCED DIRECTIVES: In the absence of any documents to the contrary the patient's husband is her healthcare power of attorney   HEALTH MAINTENANCE: Social History   Tobacco Use  . Smoking status: Never Smoker  . Smokeless tobacco: Never Used  Substance Use Topics  . Alcohol use: Yes    Comment: occassionally  . Drug use: No     Colonoscopy: n/a    PAP:  Bone density: n/a  Lipid panel:  No Known Allergies  Current Outpatient Medications  Medication Sig Dispense Refill  . ALPRAZolam (XANAX) 0.5 MG tablet Take 1 tablet (0.5 mg total) by mouth at bedtime as needed for anxiety.  0  . FLUoxetine (PROZAC) 20 MG capsule Take 1 capsule (20 mg total) by mouth daily. 30 capsule 3  . Multiple Vitamin (MULTIVITAMIN) capsule Take 1 capsule by mouth daily.    . rizatriptan (MAXALT) 5 MG tablet Take 1 tablet (5 mg total) by  mouth as needed for migraine. May repeat in 2 hours if needed  0   No current facility-administered medications for this visit.    OBJECTIVE: white woman who appears well  Vitals:   01/29/21 1331  BP: (!) 147/74  Pulse: 86  Resp: 20  Temp: 97.9 F (36.6 C)  SpO2: 100%     Body mass index is 25.97 kg/m.    ECOG FS:0 - Asymptomatic  Sclerae unicteric, EOMs intact Wearing a mask No cervical or supraclavicular adenopathy Lungs no rales or rhonchi Heart regular rate and rhythm Abd soft, nontender, positive bowel sounds MSK no focal spinal tenderness, no upper extremity lymphedema Neuro: nonfocal, well oriented, appropriate affect Breasts: The breasts show no suspicious masses.  There are no skin or nipple changes of concern.  Both axillae are benign.   LAB RESULTS:  CMP     Component Value Date/Time   NA 138 04/28/2017 1022   K 4.7 04/28/2017 1022   CL 107 03/15/2017 0526   CL 107 12/16/2012 1449   CO2 25 04/28/2017 1022   GLUCOSE 81 04/28/2017 1022   GLUCOSE 121 (H) 12/16/2012 1449   BUN 13.7 04/28/2017 1022   CREATININE 0.8 04/28/2017 1022   CALCIUM 9.4 04/28/2017 1022   PROT 6.9 04/28/2017 1022   ALBUMIN 4.3 04/28/2017 1022   AST 16 04/28/2017 1022   ALT 19 04/28/2017 1022   ALKPHOS 78 04/28/2017 1022   BILITOT 0.40 04/28/2017 1022   GFRNONAA >60 03/15/2017 0526   GFRAA >60 03/15/2017 0526    INo results found for: SPEP, UPEP  Lab Results  Component Value Date   WBC 6.1 04/28/2017   NEUTROABS 3.2 04/28/2017   HGB 12.2 04/28/2017   HCT 37.4 04/28/2017   MCV 89.0 04/28/2017   PLT 221 04/28/2017      Chemistry      Component Value Date/Time   NA 138 04/28/2017 1022   K 4.7 04/28/2017 1022   CL 107 03/15/2017 0526   CL 107 12/16/2012 1449   CO2 25 04/28/2017 1022   BUN 13.7 04/28/2017 1022   CREATININE 0.8 04/28/2017 1022      Component Value Date/Time   CALCIUM 9.4 04/28/2017 1022   ALKPHOS 78 04/28/2017 1022   AST 16 04/28/2017 1022   ALT  19 04/28/2017 1022   BILITOT 0.40 04/28/2017 1022       No results found for: LABCA2  No components found for: LABCA125  No results for input(s): INR in the last 168 hours.  Urinalysis No results found for: COLORURINE, APPEARANCEUR, LABSPEC, PHURINE, GLUCOSEU, HGBUR, BILIRUBINUR, KETONESUR, PROTEINUR, UROBILINOGEN, NITRITE, LEUKOCYTESUR   STUDIES: No results found.   ASSESSMENT: 34 y.o.  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  (2) intensified screening:  (a) yearly MRI alternating with mammogram/tomography is standard of care  (b) consider bi-annual TVUS day 1-10 of cycle starting age   30  (3) once done with family planning, to consider:  (a) OCPs for ovarian cancer risk reduction  (b) tamoxifen for breast cancer risk reduction  (c) consider bilateral mastectomies when done with nursing  (d) consider BSO after age 16  (33) severe preeclampsia with April 2018 delivery   PLAN: Leah Davis is doing fine overall and she is comfortable with her intensified screening program.  We have discussed that it would be optimal for her to have her mammography and breast MRI at the same time of each menstrual cycle (best is towards the end of just after a cycle) to minimize change due to hormone fluctuations.  Of course false positives and added biopsies is what she is trying to avoid  She understands that we do not have any effective screening for ovarian cancer.  What patients do there is simply pick a date usually between 60 and 40 years when they undergo bilateral salpingo-oophorectomy with or without hysterectomy.  She will have her mammogram in April and see her gynecologist Dr Ronita Hipps after that.  She will have her breast MRI (abbreviated) in October and see me at that time.  From that point we will start seeing her on a once a year basis  Today we discussed the various types of reconstruction she  might undergo if she decides at some point to undergo bilateral mastectomies.  Total encounter time 25 minutes.  Bina Veenstra, Virgie Dad, MD  01/29/21 1:58 PM Medical Oncology and Hematology [facility] [place], [place] [address] Tel. [phone]    Fax. [phone]   I, Wilburn Mylar, am acting as scribe for Dr. Virgie Dad. Jatziri Goffredo.  I, Lurline Del MD, have reviewed the above documentation for accuracy and completeness, and I agree with the above.   *Total Encounter Time as defined by the Centers for Medicare and Medicaid Services includes, in addition to the face-to-face time of a patient visit (documented in the note above) non-face-to-face time: obtaining and reviewing outside history, ordering and reviewing medications, tests or procedures, care coordination (communications with other health care professionals or caregivers) and documentation in the medical record.

## 2021-01-29 ENCOUNTER — Other Ambulatory Visit: Payer: Self-pay

## 2021-01-29 ENCOUNTER — Inpatient Hospital Stay: Payer: BC Managed Care – PPO | Attending: Oncology | Admitting: Oncology

## 2021-01-29 VITALS — BP 147/74 | HR 86 | Temp 97.9°F | Resp 20 | Ht 66.0 in | Wt 160.9 lb

## 2021-01-29 DIAGNOSIS — Z1509 Genetic susceptibility to other malignant neoplasm: Secondary | ICD-10-CM

## 2021-01-29 DIAGNOSIS — Z1501 Genetic susceptibility to malignant neoplasm of breast: Secondary | ICD-10-CM | POA: Diagnosis not present

## 2021-01-29 DIAGNOSIS — Z79899 Other long term (current) drug therapy: Secondary | ICD-10-CM | POA: Insufficient documentation

## 2021-01-29 DIAGNOSIS — Z803 Family history of malignant neoplasm of breast: Secondary | ICD-10-CM | POA: Insufficient documentation

## 2021-01-29 DIAGNOSIS — Z8261 Family history of arthritis: Secondary | ICD-10-CM | POA: Insufficient documentation

## 2021-01-29 DIAGNOSIS — Z1239 Encounter for other screening for malignant neoplasm of breast: Secondary | ICD-10-CM | POA: Diagnosis not present

## 2021-01-29 DIAGNOSIS — Z809 Family history of malignant neoplasm, unspecified: Secondary | ICD-10-CM | POA: Insufficient documentation

## 2021-01-30 ENCOUNTER — Telehealth: Payer: Self-pay | Admitting: Oncology

## 2021-01-30 NOTE — Telephone Encounter (Signed)
Scheduled appt per 3/2 los. Left voicemail with appt date/time.

## 2021-02-05 ENCOUNTER — Telehealth: Payer: Self-pay | Admitting: Oncology

## 2021-02-05 NOTE — Telephone Encounter (Signed)
Reschedule appointment per 03/09 schedule message. Attempted to contact patient, left a detailed message.

## 2021-02-07 ENCOUNTER — Telehealth: Payer: Self-pay | Admitting: Oncology

## 2021-04-04 ENCOUNTER — Ambulatory Visit
Admission: RE | Admit: 2021-04-04 | Discharge: 2021-04-04 | Disposition: A | Discharge status: Home / Self Care | Payer: BC Managed Care – PPO | Source: Ambulatory Visit | Attending: Oncology | Admitting: Oncology

## 2021-04-04 ENCOUNTER — Ambulatory Visit: Payer: BC Managed Care – PPO

## 2021-04-04 ENCOUNTER — Other Ambulatory Visit: Payer: Self-pay

## 2021-04-04 DIAGNOSIS — Z1501 Genetic susceptibility to malignant neoplasm of breast: Secondary | ICD-10-CM

## 2021-04-04 DIAGNOSIS — Z1239 Encounter for other screening for malignant neoplasm of breast: Secondary | ICD-10-CM

## 2021-04-04 DIAGNOSIS — Z1231 Encounter for screening mammogram for malignant neoplasm of breast: Secondary | ICD-10-CM | POA: Diagnosis not present

## 2021-04-06 DIAGNOSIS — H109 Unspecified conjunctivitis: Secondary | ICD-10-CM | POA: Diagnosis not present

## 2021-04-07 ENCOUNTER — Other Ambulatory Visit: Payer: Self-pay | Admitting: Oncology

## 2021-04-07 DIAGNOSIS — R928 Other abnormal and inconclusive findings on diagnostic imaging of breast: Secondary | ICD-10-CM

## 2021-04-09 DIAGNOSIS — B300 Keratoconjunctivitis due to adenovirus: Secondary | ICD-10-CM | POA: Diagnosis not present

## 2021-04-11 ENCOUNTER — Other Ambulatory Visit: Payer: Self-pay

## 2021-04-11 ENCOUNTER — Ambulatory Visit
Admission: RE | Admit: 2021-04-11 | Discharge: 2021-04-11 | Disposition: A | Discharge status: Home / Self Care | Payer: BC Managed Care – PPO | Source: Ambulatory Visit | Attending: Oncology | Admitting: Oncology

## 2021-04-11 ENCOUNTER — Ambulatory Visit: Payer: BC Managed Care – PPO

## 2021-04-11 DIAGNOSIS — R928 Other abnormal and inconclusive findings on diagnostic imaging of breast: Secondary | ICD-10-CM

## 2021-04-11 DIAGNOSIS — R922 Inconclusive mammogram: Secondary | ICD-10-CM | POA: Diagnosis not present

## 2021-04-17 DIAGNOSIS — B300 Keratoconjunctivitis due to adenovirus: Secondary | ICD-10-CM | POA: Diagnosis not present

## 2021-04-17 DIAGNOSIS — S0502XA Injury of conjunctiva and corneal abrasion without foreign body, left eye, initial encounter: Secondary | ICD-10-CM | POA: Diagnosis not present

## 2021-04-22 DIAGNOSIS — S0502XA Injury of conjunctiva and corneal abrasion without foreign body, left eye, initial encounter: Secondary | ICD-10-CM | POA: Diagnosis not present

## 2021-04-22 DIAGNOSIS — B300 Keratoconjunctivitis due to adenovirus: Secondary | ICD-10-CM | POA: Diagnosis not present

## 2021-04-25 ENCOUNTER — Other Ambulatory Visit: Payer: BC Managed Care – PPO

## 2021-05-20 DIAGNOSIS — B300 Keratoconjunctivitis due to adenovirus: Secondary | ICD-10-CM | POA: Diagnosis not present

## 2021-06-13 DIAGNOSIS — G43709 Chronic migraine without aura, not intractable, without status migrainosus: Secondary | ICD-10-CM | POA: Diagnosis not present

## 2021-06-13 DIAGNOSIS — Z131 Encounter for screening for diabetes mellitus: Secondary | ICD-10-CM | POA: Diagnosis not present

## 2021-06-13 DIAGNOSIS — Z0000 Encounter for general adult medical examination without abnormal findings: Secondary | ICD-10-CM | POA: Diagnosis not present

## 2021-06-13 DIAGNOSIS — H109 Unspecified conjunctivitis: Secondary | ICD-10-CM | POA: Diagnosis not present

## 2021-06-13 DIAGNOSIS — Z13220 Encounter for screening for lipoid disorders: Secondary | ICD-10-CM | POA: Diagnosis not present

## 2021-06-13 DIAGNOSIS — Z1329 Encounter for screening for other suspected endocrine disorder: Secondary | ICD-10-CM | POA: Diagnosis not present

## 2021-07-01 DIAGNOSIS — B300 Keratoconjunctivitis due to adenovirus: Secondary | ICD-10-CM | POA: Diagnosis not present

## 2021-07-31 DIAGNOSIS — B300 Keratoconjunctivitis due to adenovirus: Secondary | ICD-10-CM | POA: Diagnosis not present

## 2021-08-04 ENCOUNTER — Other Ambulatory Visit: Payer: Self-pay | Admitting: Oncology

## 2021-08-22 ENCOUNTER — Other Ambulatory Visit: Payer: Self-pay

## 2021-08-22 ENCOUNTER — Ambulatory Visit
Admission: RE | Admit: 2021-08-22 | Discharge: 2021-08-22 | Disposition: A | Discharge status: Home / Self Care | Payer: BC Managed Care – PPO | Source: Ambulatory Visit | Attending: Oncology | Admitting: Oncology

## 2021-08-22 DIAGNOSIS — Z1501 Genetic susceptibility to malignant neoplasm of breast: Secondary | ICD-10-CM | POA: Diagnosis not present

## 2021-08-22 DIAGNOSIS — Z01419 Encounter for gynecological examination (general) (routine) without abnormal findings: Secondary | ICD-10-CM | POA: Diagnosis not present

## 2021-08-22 DIAGNOSIS — Z124 Encounter for screening for malignant neoplasm of cervix: Secondary | ICD-10-CM | POA: Diagnosis not present

## 2021-08-22 DIAGNOSIS — Z01411 Encounter for gynecological examination (general) (routine) with abnormal findings: Secondary | ICD-10-CM | POA: Diagnosis not present

## 2021-08-22 DIAGNOSIS — Z1239 Encounter for other screening for malignant neoplasm of breast: Secondary | ICD-10-CM

## 2021-08-22 DIAGNOSIS — N62 Hypertrophy of breast: Secondary | ICD-10-CM | POA: Diagnosis not present

## 2021-08-22 DIAGNOSIS — Z803 Family history of malignant neoplasm of breast: Secondary | ICD-10-CM | POA: Diagnosis not present

## 2021-08-22 DIAGNOSIS — Z6825 Body mass index (BMI) 25.0-25.9, adult: Secondary | ICD-10-CM | POA: Diagnosis not present

## 2021-08-22 MED ORDER — GADOBUTROL 1 MMOL/ML IV SOLN
7.0000 mL | Freq: Once | INTRAVENOUS | Status: AC | PRN
  Administered 2021-08-22: 7 mL via INTRAVENOUS

## 2021-09-04 ENCOUNTER — Ambulatory Visit: Payer: BC Managed Care – PPO | Admitting: Oncology

## 2021-09-10 ENCOUNTER — Ambulatory Visit: Payer: BC Managed Care – PPO | Admitting: Oncology

## 2021-09-15 ENCOUNTER — Ambulatory Visit: Payer: BC Managed Care – PPO | Admitting: Oncology

## 2021-09-19 DIAGNOSIS — Z801 Family history of malignant neoplasm of trachea, bronchus and lung: Secondary | ICD-10-CM | POA: Diagnosis not present

## 2021-10-22 ENCOUNTER — Ambulatory Visit: Payer: BC Managed Care – PPO | Admitting: Oncology

## 2021-11-13 ENCOUNTER — Inpatient Hospital Stay: Payer: BC Managed Care – PPO | Attending: Oncology | Admitting: Oncology

## 2021-11-13 ENCOUNTER — Other Ambulatory Visit: Payer: Self-pay

## 2021-11-13 VITALS — BP 153/88 | HR 76 | Temp 97.9°F | Resp 18 | Ht 66.0 in | Wt 168.2 lb

## 2021-11-13 DIAGNOSIS — Z1239 Encounter for other screening for malignant neoplasm of breast: Secondary | ICD-10-CM | POA: Diagnosis not present

## 2021-11-13 DIAGNOSIS — Z1509 Genetic susceptibility to other malignant neoplasm: Secondary | ICD-10-CM | POA: Insufficient documentation

## 2021-11-13 DIAGNOSIS — Z803 Family history of malignant neoplasm of breast: Secondary | ICD-10-CM | POA: Diagnosis not present

## 2021-11-13 DIAGNOSIS — Z1501 Genetic susceptibility to malignant neoplasm of breast: Secondary | ICD-10-CM

## 2021-11-13 NOTE — Progress Notes (Signed)
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  She also follows closely with Dr. Ronita Hipps in gynecology  Since her last visit, she underwent screening mammogram on 04/04/2021 showing possible left breast asymmetry. She proceeded to left diagnostic mammography with tomography at [facility] on 04/11/2021 showing: breast density category C; no persistent suspicious abnormality at site of screening study finding.  She also underwent screening breast MRI on 08/22/2021 showing: breast composition B; no evidence of breast malignancy.  Cost out-of-pocket for this MRI was $850.   REVIEW OF SYSTEMS: Leah Davis is enjoying her work.  She goes to the gym at least 4 times a week.  Sometimes she feels a sense of pressure in the lateral aspect of the left breast.  She does not palpate anything there.  She wonders if this could be the clip from prior biopsy.  A detailed review of systems today was otherwise stable.   COVID 19 VACCINATION STATUS: Status post vaccine x2 as of December 2022  FAMILY BREAST/OVARIAN CANCER HISTORY: The patient's mother was diagnosed with breast cancer at age 70, and her maternal grandmother also with breast cancer around age 31. The patient's mother was my patient Gaylyn Lambert. Leah Davis herself was tested in September 2013 and was found to carry the familial mutation,in BRCA1, namely 187delAG.. This is one of the common Ashkenazi mutations.   PAST MEDICAL HISTORY: Past Medical History:  Diagnosis  Date   Anxiety    BRCA1 positive 2014   Condyloma acuminata 05/2012   see 06/17/2012 note.   Migraine    WITHOUT AURA   PMS (premenstrual syndrome)    Postpartum care following vaginal delivery (7/25) 06/23/2014   Pregnancy 02/24/2014    PAST SURGICAL HISTORY: Past Surgical History:  Procedure Laterality Date   BREAST BIOPSY Right 12/30/2017   BREAST BIOPSY Left 09/16/2020   ivf     WISDOM TOOTH EXTRACTION      FAMILY HISTORY Family History  Problem Relation Age of Onset   Breast cancer Mother 25   Breast cancer Maternal Grandmother        diagnosed age 64-44   Rheum arthritis Father    Cancer Paternal Grandfather     GYNECOLOGIC HISTORY:  No LMP recorded. GX P2.  As of December 2022 Leah Davis is still having regular periods.  She and her husband have 4 frozen embryos available.  However she did have significant preeclampsia with pregnancies previously.  Currently they are not using any contraception.  If she becomes pregnant they may release the embryos.  At this point though (December 2022) they have not made a definitive decision whether or not to pursue further family    SOCIAL HISTORY: (Updated December 2022) Leah Davis works as a Copywriter, advertising. Her husband Leah Davis works for an Production designer, theatre/television/film.  Their 2 children are 38 and 55 years old as of December 2022.  The patient attends a [employer]    ADVANCED DIRECTIVES: In the absence of any documents to the contrary the patient's husband is her healthcare power of attorney   HEALTH MAINTENANCE: Social  History   Tobacco Use   Smoking status: Never   Smokeless tobacco: Never  Substance Use Topics   Alcohol use: Yes    Comment: occassionally   Drug use: No     Colonoscopy: n/a  PAP:  Bone density: n/a  Lipid panel:  No Known Allergies  Current Outpatient Medications  Medication Sig Dispense Refill   ALPRAZolam (XANAX) 0.5 MG tablet Take 1 tablet (0.5 mg total) by mouth at bedtime as needed for anxiety.   0   FLUoxetine (PROZAC) 20 MG capsule Take 1 capsule (20 mg total) by mouth daily. 30 capsule 3   Multiple Vitamin (MULTIVITAMIN) capsule Take 1 capsule by mouth daily.     rizatriptan (MAXALT) 5 MG tablet Take 1 tablet (5 mg total) by mouth as needed for migraine. May repeat in 2 hours if needed  0   No current facility-administered medications for this visit.    OBJECTIVE: white woman who appears well  Vitals:   11/13/21 1534  BP: (!) 153/88  Pulse: 76  Resp: 18  Temp: 97.9 F (36.6 C)  SpO2: 100%      Body mass index is 27.15 kg/m.    ECOG FS:0 - Asymptomatic  Sclerae unicteric, EOMs intact Wearing a mask No cervical or supraclavicular adenopathy Lungs no rales or rhonchi Heart regular rate and rhythm Abd soft, nontender, positive bowel sounds MSK no focal spinal tenderness, no upper extremity lymphedema Neuro: nonfocal, well oriented, appropriate affect Breasts: I do not palpate a mass in either breast.  There are no skin or nipple changes of concern both axillae are benign.   LAB RESULTS:  CMP     Component Value Date/Time   NA 138 04/28/2017 1022   K 4.7 04/28/2017 1022   CL 107 03/15/2017 0526   CL 107 12/16/2012 1449   CO2 25 04/28/2017 1022   GLUCOSE 81 04/28/2017 1022   GLUCOSE 121 (H) 12/16/2012 1449   BUN 13.7 04/28/2017 1022   CREATININE 0.8 04/28/2017 1022   CALCIUM 9.4 04/28/2017 1022   PROT 6.9 04/28/2017 1022   ALBUMIN 4.3 04/28/2017 1022   AST 16 04/28/2017 1022   ALT 19 04/28/2017 1022   ALKPHOS 78 04/28/2017 1022   BILITOT 0.40 04/28/2017 1022   GFRNONAA >60 03/15/2017 0526   GFRAA >60 03/15/2017 0526    INo results found for: SPEP, UPEP  Lab Results  Component Value Date   WBC 6.1 04/28/2017   NEUTROABS 3.2 04/28/2017   HGB 12.2 04/28/2017   HCT 37.4 04/28/2017   MCV 89.0 04/28/2017   PLT 221 04/28/2017      Chemistry      Component Value Date/Time   NA 138 04/28/2017 1022   K 4.7 04/28/2017 1022   CL 107 03/15/2017 0526    CL 107 12/16/2012 1449   CO2 25 04/28/2017 1022   BUN 13.7 04/28/2017 1022   CREATININE 0.8 04/28/2017 1022      Component Value Date/Time   CALCIUM 9.4 04/28/2017 1022   ALKPHOS 78 04/28/2017 1022   AST 16 04/28/2017 1022   ALT 19 04/28/2017 1022   BILITOT 0.40 04/28/2017 1022       No results found for: LABCA2  No components found for: LABCA125  No results for input(s): INR in the last 168 hours.  Urinalysis No results found for: COLORURINE, APPEARANCEUR, LABSPEC, PHURINE, GLUCOSEU, HGBUR, BILIRUBINUR, KETONESUR, PROTEINUR, UROBILINOGEN, NITRITE, LEUKOCYTESUR   STUDIES: No results found.   ASSESSMENT: 35 y.o.  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  (2) intensified screening:  (a) yearly MRI alternating with mammogram/tomography  (b) consider bi-annual TVUS day 1-10 of cycle starting age 73  (80) once done with family planning, to consider:  (a) OCPs for ovarian cancer risk reduction  (b) tamoxifen for breast cancer risk reduction  (c) consider bilateral mastectomies versus continuing intensified screening when done with nursing  (d) consider BSO by age 48  (66) severe preeclampsia with April 2018 delivery   PLAN: Sherran is doing well and is comfortable with the intensified screening program.  She also sees Dr. Ronita Hipps at least once a year and he he did a pelvic ultrasound in the fall which according to the patient showed a stable fibroid.  She and her husband have not yet made a definitive decision regarding family-planning but they are aware of the options and they will let us know if and when they do decide.  Certainly by the age of 35 she may want to proceed to bilateral salpingo-oophorectomy.  The intensified breast cancer screening can continue indefinitely  I commended her excellent exercise program  I reassured her that the discomfort she  sometimes feels in the left breast is not going to be breast cancer.  Breast cancer is silent in the breast.  We are going to change the MRI to abbreviated for cost reasons.  If for some reason she meets her deductible and it would be more favorable financially for her to have the full MRI instead of abbreviated (which is not covered by insurance) she will let us know and we can change that order.  Otherwise she is being scheduled for mammography in April and breast MRI in October.  I have asked her to make sure to get those tests on day 7 through 10 after start of her period.  She will return to see [place] in 1 year.  She knows to call for any other issue that may develop before then.  Total encounter time 25 minutes.   Alen Matheson, Virgie Dad, MD  11/13/21 3:42 PM Medical Oncology and Hematology [facility] [place], [place] [address] Tel. [phone]    Fax. [phone]   I, Wilburn Mylar, am acting as scribe for Dr. Virgie Dad. Ami Thornsberry.  I, Lurline Del MD, have reviewed the above documentation for accuracy and completeness, and I agree with the above.   *Total Encounter Time as defined by the Centers for Medicare and Medicaid Services includes, in addition to the face-to-face time of a patient visit (documented in the note above) non-face-to-face time: obtaining and reviewing outside history, ordering and reviewing medications, tests or procedures, care coordination (communications with other health care professionals or caregivers) and documentation in the medical record.

## 2021-11-14 ENCOUNTER — Telehealth: Payer: Self-pay | Admitting: Oncology

## 2021-11-14 NOTE — Telephone Encounter (Signed)
Scheduled appointment per 12/15 los. Patient is aware. 

## 2021-12-20 IMAGING — MR MR BREAST BILAT WO/W CM
6 of 9 series · 31 of 48 positions shown · IV contrast (7 ml gadavist)
Comparison: Previous exam(s).

CLINICAL DATA: Q7AUJ positive patient.  Screening MRI.

LABS:  None
EXAM:
BILATERAL BREAST MRI WITH AND WITHOUT CONTRAST
TECHNIQUE: Multiplanar, multisequence MR images of both breasts were obtained
prior to and following the intravenous administration of 7 ml of
Gadavist

[Series 2: t2_tirm_tra ipat (a-p) · axial · 3.0mm · 0.70mm/px · z∈[-98,+79]mm · 2 of 60 slices shown]
[im 1/60]
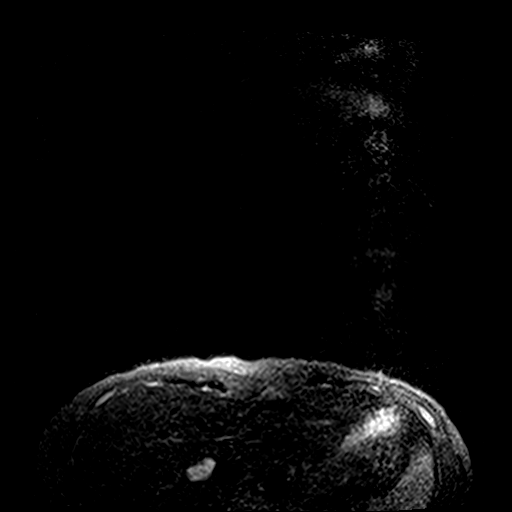
[im 60/60]
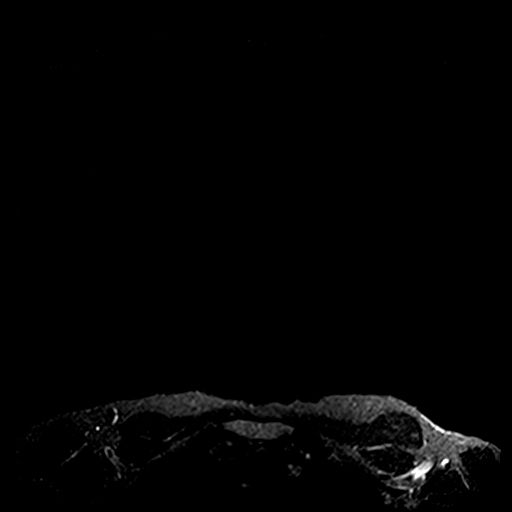

[Series 3: fl3d pre-cm no · axial · non-contrast · 1.2mm · 0.94mm/px · z∈[-105,+86]mm · 7 of 160 slices shown]
[im 1/160]
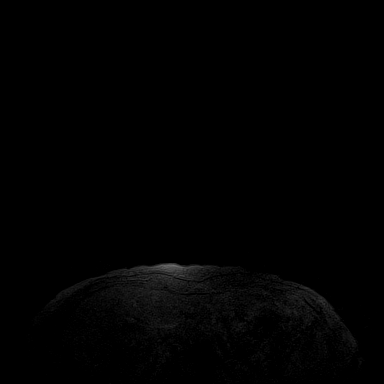
[im 27/160]
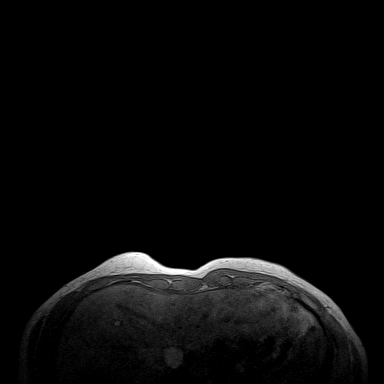
[im 54/160]
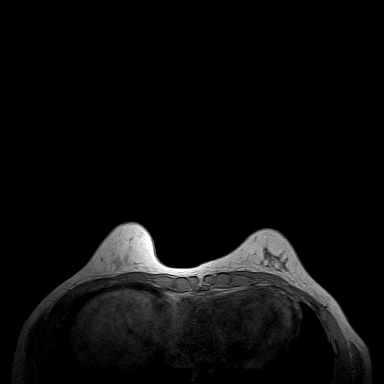
[im 80/160]
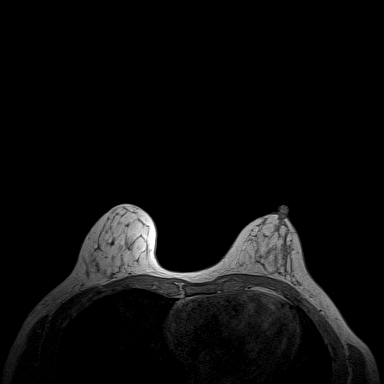
[im 107/160]
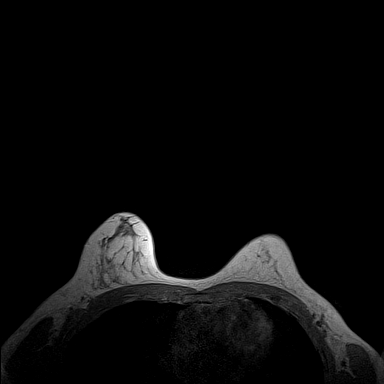
[im 133/160]
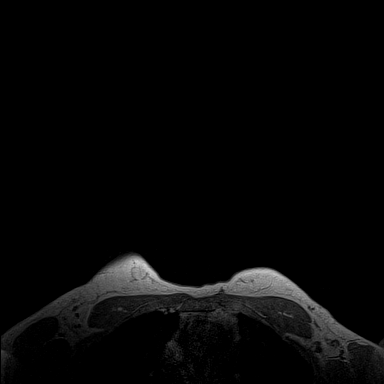
[im 160/160]
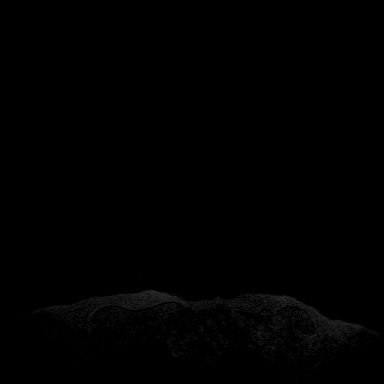

[Series 4: fl3d pre-cm · axial · non-contrast · 1.2mm · 0.94mm/px · z∈[-105,+86]mm · 7 of 160 slices shown]
[im 1/160]
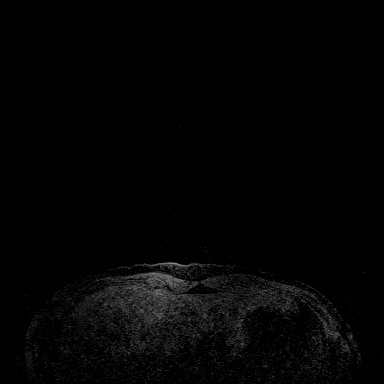
[im 27/160]
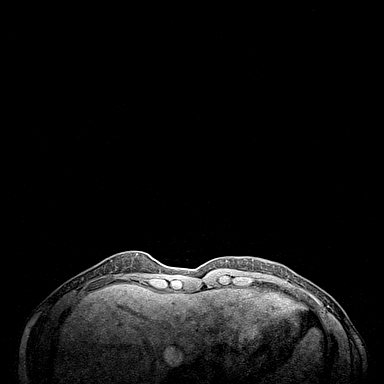
[im 54/160]
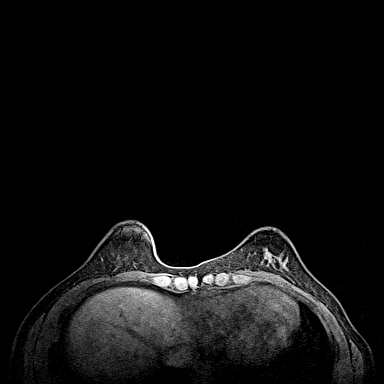
[im 80/160]
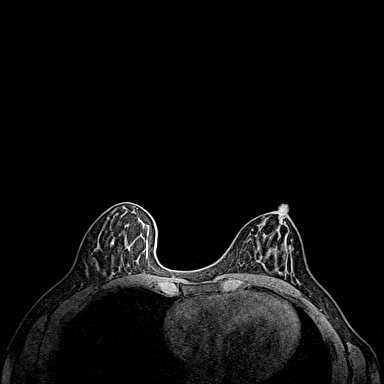
[im 107/160]
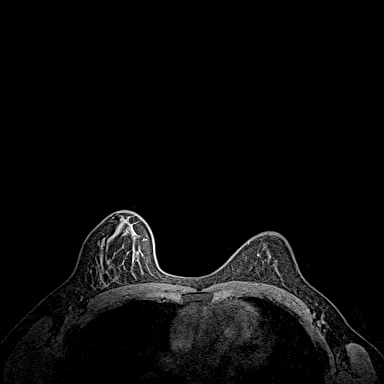
[im 133/160]
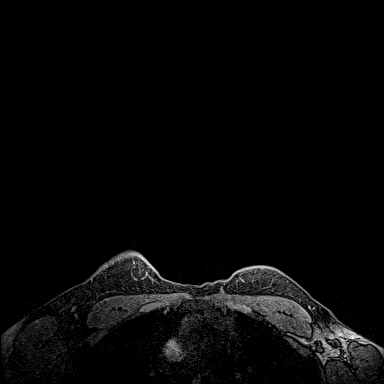
[im 160/160]
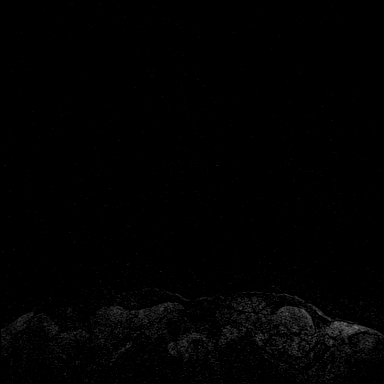

[Series 5: fl3d post-cm 20 · axial · 1.2mm · 0.94mm/px · z∈[-105,+86]mm · 7 of 160 slices shown (1 of 2)]
[im 1/160]
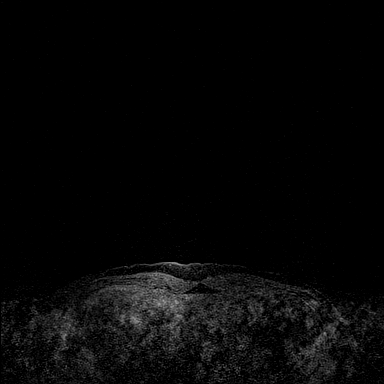
[im 27/160]
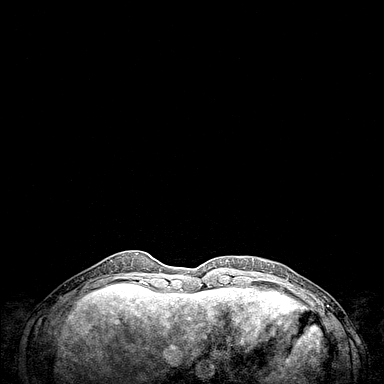
[im 54/160]
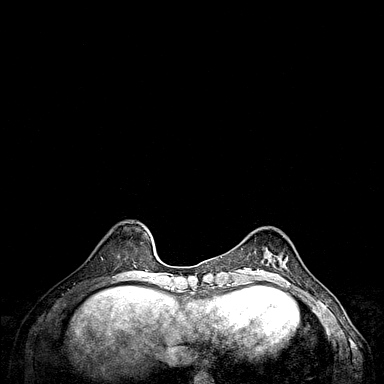
[im 80/160]
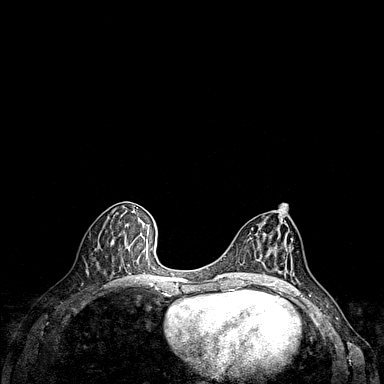
[im 107/160]
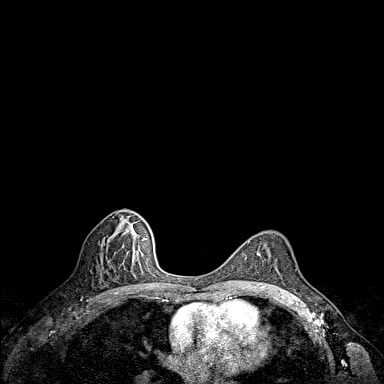
[im 133/160]
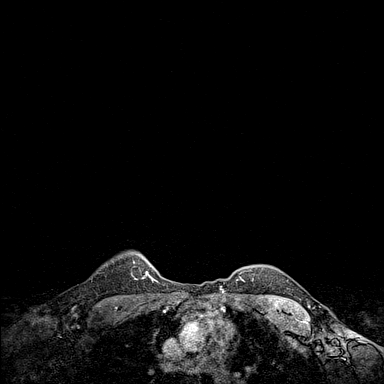
[im 160/160]
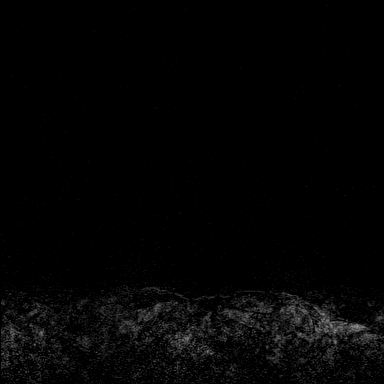

[Series 7: fl3d post-cm 20 · axial · 192.0mm · 0.94mm/px · 1 of 1 slices shown (2 of 2)]
[im 1/1]
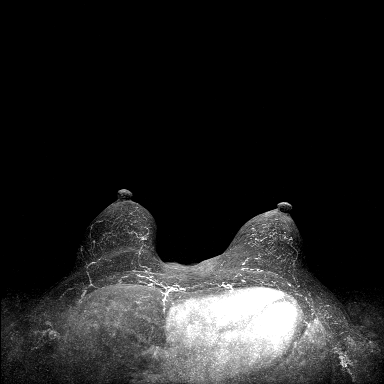

[Series 8: fl3d post-cm 3min · axial · 1.2mm · 0.94mm/px · z∈[-105,+86]mm · 7 of 160 slices shown]
[im 1/160]
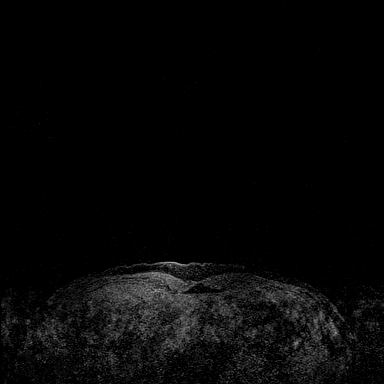
[im 27/160]
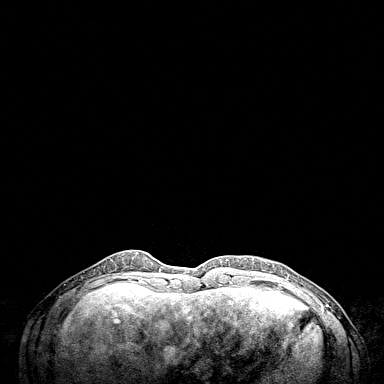
[im 54/160]
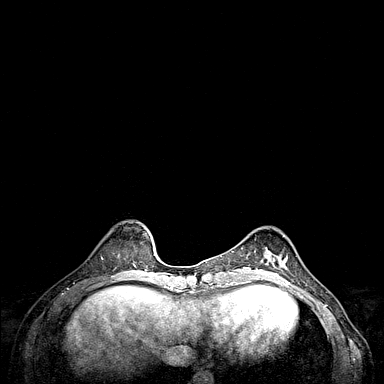
[im 80/160]
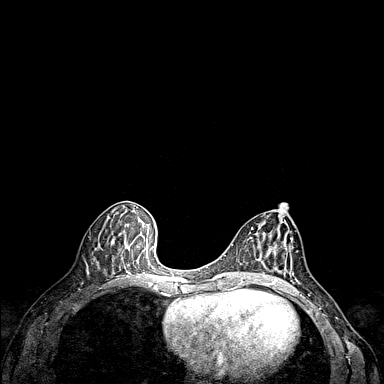
[im 107/160]
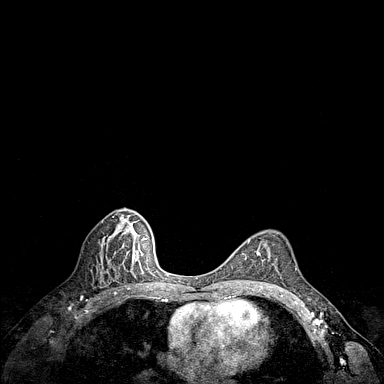
[im 133/160]
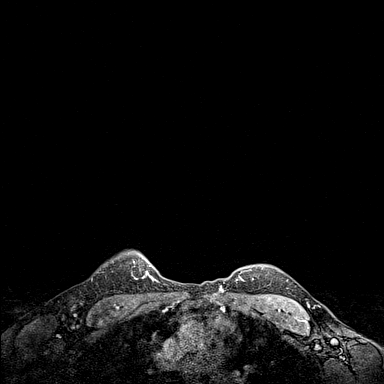
[im 160/160]
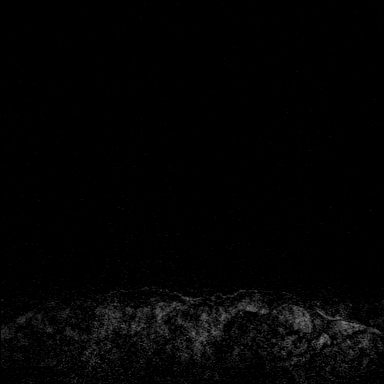

[31 of 48 positions shown; findings below may reference images not displayed]

Three-dimensional MR images were rendered by post-processing of the
original MR data on an independent workstation. The
three-dimensional MR images were interpreted, and findings are
reported in the following complete MRI report for this study. Three
dimensional images were evaluated at the independent interpreting
workstation using the DynaCAD thin client.
FINDINGS: Breast composition: b. Scattered fibroglandular tissue.

Background parenchymal enhancement: Minimal

Right breast: No mass or abnormal enhancement.

Left breast: There is a new 1.3 cm mass in the upper outer posterior
left breast best seen on series 3, image 64, the T1 sequence. This
mass demonstrates mild increased T2 signal. The mass demonstrates no
enhancement on 20 second delayed images and minimal enhancement on 5
minutes delayed images. No other suspicious findings are seen in the
left breast.

Lymph nodes: No abnormal appearing lymph nodes.

Ancillary findings: There is a cyst in the liver seen on STIR image
57 of no significance.
IMPRESSION: 1. There is a mass in the upper outer posterior left breast. This
mass is of relatively low suspicion given the lack of significant
enhancement. However, it is clearly new compared to multiple
previous studies.
2. No MRI evidence of malignancy in the right breast.
3. No other suspicious findings.

RECOMMENDATION:
Recommend MRI guided biopsy of the new 1.3 cm left breast mass in
the upper outer posterior left breast. While the lack of significant
enhancement is very reassuring, the presence of a new mass in a
Q7AUJ positive patient is indeterminate. I suspect this will
represent a benign etiology such as PASH.

Given the lack of significant enhancement, this mass could probably
be biopsied on T1 imaging without contrast.

BI-RADS CATEGORY  4: Suspicious.

## 2022-02-03 ENCOUNTER — Other Ambulatory Visit: Payer: Self-pay | Admitting: Obstetrics and Gynecology

## 2022-02-03 DIAGNOSIS — Z1239 Encounter for other screening for malignant neoplasm of breast: Secondary | ICD-10-CM

## 2022-02-03 DIAGNOSIS — Z1501 Genetic susceptibility to malignant neoplasm of breast: Secondary | ICD-10-CM

## 2022-03-04 ENCOUNTER — Telehealth: Payer: Self-pay | Admitting: *Deleted

## 2022-03-04 ENCOUNTER — Other Ambulatory Visit: Payer: Self-pay | Admitting: *Deleted

## 2022-03-04 ENCOUNTER — Other Ambulatory Visit: Payer: Self-pay | Admitting: Hematology and Oncology

## 2022-03-04 DIAGNOSIS — Z1501 Genetic susceptibility to malignant neoplasm of breast: Secondary | ICD-10-CM

## 2022-03-04 DIAGNOSIS — N6320 Unspecified lump in the left breast, unspecified quadrant: Secondary | ICD-10-CM

## 2022-03-04 DIAGNOSIS — Z1239 Encounter for other screening for malignant neoplasm of breast: Secondary | ICD-10-CM

## 2022-03-10 ENCOUNTER — Ambulatory Visit
Admission: RE | Admit: 2022-03-10 | Discharge: 2022-03-10 | Disposition: A | Discharge status: Home / Self Care | Payer: BC Managed Care – PPO | Source: Ambulatory Visit | Attending: Hematology and Oncology | Admitting: Hematology and Oncology

## 2022-03-10 ENCOUNTER — Ambulatory Visit: Payer: BC Managed Care – PPO

## 2022-03-10 ENCOUNTER — Other Ambulatory Visit: Payer: Self-pay | Admitting: Hematology and Oncology

## 2022-03-10 DIAGNOSIS — Z1509 Genetic susceptibility to other malignant neoplasm: Secondary | ICD-10-CM

## 2022-03-10 DIAGNOSIS — Z1239 Encounter for other screening for malignant neoplasm of breast: Secondary | ICD-10-CM

## 2022-03-10 DIAGNOSIS — R922 Inconclusive mammogram: Secondary | ICD-10-CM | POA: Diagnosis not present

## 2022-03-11 DIAGNOSIS — R1032 Left lower quadrant pain: Secondary | ICD-10-CM | POA: Diagnosis not present

## 2022-04-10 ENCOUNTER — Ambulatory Visit: Payer: BC Managed Care – PPO

## 2022-06-16 DIAGNOSIS — G43009 Migraine without aura, not intractable, without status migrainosus: Secondary | ICD-10-CM | POA: Diagnosis not present

## 2022-08-13 NOTE — Telephone Encounter (Signed)
No entry 

## 2022-08-28 DIAGNOSIS — Z0000 Encounter for general adult medical examination without abnormal findings: Secondary | ICD-10-CM | POA: Diagnosis not present

## 2022-08-28 DIAGNOSIS — Z0142 Encounter for cervical smear to confirm findings of recent normal smear following initial abnormal smear: Secondary | ICD-10-CM | POA: Diagnosis not present

## 2022-08-28 DIAGNOSIS — Z124 Encounter for screening for malignant neoplasm of cervix: Secondary | ICD-10-CM | POA: Diagnosis not present

## 2022-08-28 DIAGNOSIS — Z01419 Encounter for gynecological examination (general) (routine) without abnormal findings: Secondary | ICD-10-CM | POA: Diagnosis not present

## 2022-08-28 DIAGNOSIS — Z13220 Encounter for screening for lipoid disorders: Secondary | ICD-10-CM | POA: Diagnosis not present

## 2022-08-28 DIAGNOSIS — Z131 Encounter for screening for diabetes mellitus: Secondary | ICD-10-CM | POA: Diagnosis not present

## 2022-08-28 DIAGNOSIS — Z6825 Body mass index (BMI) 25.0-25.9, adult: Secondary | ICD-10-CM | POA: Diagnosis not present

## 2022-08-28 DIAGNOSIS — Z1329 Encounter for screening for other suspected endocrine disorder: Secondary | ICD-10-CM | POA: Diagnosis not present

## 2022-09-10 DIAGNOSIS — B300 Keratoconjunctivitis due to adenovirus: Secondary | ICD-10-CM | POA: Diagnosis not present

## 2022-09-25 DIAGNOSIS — Z803 Family history of malignant neoplasm of breast: Secondary | ICD-10-CM | POA: Diagnosis not present

## 2022-09-25 DIAGNOSIS — Z1501 Genetic susceptibility to malignant neoplasm of breast: Secondary | ICD-10-CM | POA: Diagnosis not present

## 2022-10-15 DIAGNOSIS — B300 Keratoconjunctivitis due to adenovirus: Secondary | ICD-10-CM | POA: Diagnosis not present

## 2022-11-12 ENCOUNTER — Telehealth: Payer: Self-pay | Admitting: Hematology and Oncology

## 2022-11-12 NOTE — Telephone Encounter (Signed)
Per 12/14 IB, message left

## 2022-11-13 ENCOUNTER — Inpatient Hospital Stay: Payer: BC Managed Care – PPO | Admitting: Hematology and Oncology

## 2022-12-03 DIAGNOSIS — B300 Keratoconjunctivitis due to adenovirus: Secondary | ICD-10-CM | POA: Diagnosis not present

## 2022-12-09 ENCOUNTER — Ambulatory Visit: Payer: BC Managed Care – PPO | Admitting: Hematology and Oncology

## 2022-12-11 ENCOUNTER — Encounter: Payer: Self-pay | Admitting: Hematology and Oncology

## 2022-12-11 ENCOUNTER — Inpatient Hospital Stay: Payer: BC Managed Care – PPO | Attending: Hematology and Oncology | Admitting: Hematology and Oncology

## 2022-12-11 VITALS — BP 169/65 | HR 81 | Temp 97.9°F | Resp 18 | Ht 66.0 in | Wt 175.3 lb

## 2022-12-11 DIAGNOSIS — Z803 Family history of malignant neoplasm of breast: Secondary | ICD-10-CM | POA: Diagnosis not present

## 2022-12-11 DIAGNOSIS — N83209 Unspecified ovarian cyst, unspecified side: Secondary | ICD-10-CM | POA: Insufficient documentation

## 2022-12-11 DIAGNOSIS — Z7183 Encounter for nonprocreative genetic counseling: Secondary | ICD-10-CM | POA: Diagnosis not present

## 2022-12-11 DIAGNOSIS — Z1289 Encounter for screening for malignant neoplasm of other sites: Secondary | ICD-10-CM

## 2022-12-11 DIAGNOSIS — Z1239 Encounter for other screening for malignant neoplasm of breast: Secondary | ICD-10-CM | POA: Diagnosis not present

## 2022-12-11 DIAGNOSIS — Z8759 Personal history of other complications of pregnancy, childbirth and the puerperium: Secondary | ICD-10-CM | POA: Diagnosis not present

## 2022-12-11 DIAGNOSIS — Z1501 Genetic susceptibility to malignant neoplasm of breast: Secondary | ICD-10-CM | POA: Diagnosis not present

## 2022-12-11 DIAGNOSIS — Z1509 Genetic susceptibility to other malignant neoplasm: Secondary | ICD-10-CM | POA: Diagnosis not present

## 2022-12-11 NOTE — Progress Notes (Signed)
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  She also follows closely with Dr. Ronita Hipps in gynecology. She had her last mammogram in April, did not have her MRI in 2023.  She is not quite sure how she missed it.  She denies any palpable breast changes, reports bit of discomfort in the left breast at the lateral edge which was thought to be likely related to prior biopsy and clip.  No other change in breathing, bowel habits or urinary habits.  She has an ovarian cyst found on recent exam at Dr. Kennith Maes office and hence has a follow-up regarding this.  She is still contemplating if she wants to have more children.  Rest of the pertinent 10 point ROS reviewed and negative   COVID 19 VACCINATION STATUS: Status post vaccine x2 as of December 2022  FAMILY BREAST/OVARIAN CANCER HISTORY: The patient's mother was diagnosed with breast cancer at age 24, and her maternal grandmother also with breast cancer around age 51. The patient's mother was my patient Gaylyn Lambert. Leah Davis herself was tested in September 2013 and was found to carry the familial mutation,in BRCA1, namely [id number].. This is one of the common Ashkenazi mutations.   PAST MEDICAL HISTORY: Past Medical History:  Diagnosis Date   Anxiety    BRCA1 positive 2014   Condyloma acuminata 05/2012   see 06/17/2012 note.   Migraine    WITHOUT AURA   PMS (premenstrual syndrome)    Postpartum care following vaginal delivery (7/25) 06/23/2014   Pregnancy 02/24/2014     PAST SURGICAL HISTORY: Past Surgical History:  Procedure Laterality Date   BREAST BIOPSY Right 12/30/2017   BREAST BIOPSY Left 09/16/2020   ivf     WISDOM TOOTH EXTRACTION      FAMILY HISTORY Family History  Problem Relation Age of Onset   Breast cancer Mother 77   Breast cancer Maternal Grandmother        diagnosed age 70-44   Rheum arthritis Father    Cancer Paternal Grandfather     GYNECOLOGIC HISTORY:  No LMP recorded. GX P2.  As of December 2022 Leah Davis is still having regular periods.  She and her husband have 4 frozen embryos available.  However she did have significant preeclampsia with pregnancies previously.  Currently they are not using any contraception.  If she becomes pregnant they may release the embryos.  At this point though (December 2022) they have not made a definitive decision whether or not to pursue further family    SOCIAL HISTORY: (Updated December 2022) Leah Davis works as a Copywriter, advertising. Her husband Ronalee Belts works for an Production designer, theatre/television/film.  Their 2 children are 4 and 43 years old as of December 2022.  The patient attends a [employer]    ADVANCED DIRECTIVES: In the absence of any documents to the contrary the patient's husband is her healthcare power of attorney   HEALTH MAINTENANCE: Social History   Tobacco Use   Smoking status: Never   Smokeless tobacco: Never  Substance Use Topics   Alcohol use: Yes    Comment: occassionally  Drug use: No     Colonoscopy: n/a  PAP:  Bone density: n/a  Lipid panel:  No Known Allergies  Current Outpatient Medications  Medication Sig Dispense Refill   ALPRAZolam (XANAX) 0.5 MG tablet Take 1 tablet (0.5 mg total) by mouth at bedtime as needed for anxiety.  0   FLUoxetine (PROZAC) 20 MG capsule Take 1 capsule (20 mg total) by mouth daily. 30 capsule 3   Multiple Vitamin (MULTIVITAMIN) capsule Take 1 capsule by mouth daily.     rizatriptan (MAXALT) 5 MG tablet Take 1 tablet (5 mg total) by  mouth as needed for migraine. May repeat in 2 hours if needed  0   No current facility-administered medications for this visit.    OBJECTIVE: white woman who appears well  There were no vitals filed for this visit.     There is no height or weight on file to calculate BMI.    ECOG FS:0 - Asymptomatic  Sclerae unicteric, EOMs intact Wearing a mask No cervical or supraclavicular adenopathy Lungs no rales or rhonchi Heart regular rate and rhythm Abd soft, nontender, positive bowel sounds Bilateral breast examined.  No concern for masses in either breast.  Bilateral axilla clear.  No other regional adenopathy.   LAB RESULTS:  CMP     Component Value Date/Time   NA 138 04/28/2017 1022   K 4.7 04/28/2017 1022   CL 107 03/15/2017 0526   CL 107 12/16/2012 1449   CO2 25 04/28/2017 1022   GLUCOSE 81 04/28/2017 1022   GLUCOSE 121 (H) 12/16/2012 1449   BUN 13.7 04/28/2017 1022   CREATININE 0.8 04/28/2017 1022   CALCIUM 9.4 04/28/2017 1022   PROT 6.9 04/28/2017 1022   ALBUMIN 4.3 04/28/2017 1022   AST 16 04/28/2017 1022   ALT 19 04/28/2017 1022   ALKPHOS 78 04/28/2017 1022   BILITOT 0.40 04/28/2017 1022   GFRNONAA >60 03/15/2017 0526   GFRAA >60 03/15/2017 0526    INo results found for: "SPEP", "UPEP"  Lab Results  Component Value Date   WBC 6.1 04/28/2017   NEUTROABS 3.2 04/28/2017   HGB 12.2 04/28/2017   HCT 37.4 04/28/2017   MCV 89.0 04/28/2017   PLT 221 04/28/2017      Chemistry      Component Value Date/Time   NA 138 04/28/2017 1022   K 4.7 04/28/2017 1022   CL 107 03/15/2017 0526   CL 107 12/16/2012 1449   CO2 25 04/28/2017 1022   BUN 13.7 04/28/2017 1022   CREATININE 0.8 04/28/2017 1022      Component Value Date/Time   CALCIUM 9.4 04/28/2017 1022   ALKPHOS 78 04/28/2017 1022   AST 16 04/28/2017 1022   ALT 19 04/28/2017 1022   BILITOT 0.40 04/28/2017 1022       No results found for: "LABCA2"  No components found for: "LABCA125"  No results  for input(s): "INR" in the last 168 hours.  Urinalysis No results found for: "COLORURINE", "APPEARANCEUR", "LABSPEC", "PHURINE", "GLUCOSEU", "HGBUR", "BILIRUBINUR", "KETONESUR", "PROTEINUR", "UROBILINOGEN", "NITRITE", "LEUKOCYTESUR"   STUDIES: No results found.   ASSESSMENT: 37 y.o.  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  (2) intensified screening:  (a) yearly MRI alternating with mammogram/tomography  (b) consider bi-annual TVUS day 1-10 of cycle starting age 74  (3) once done with family planning, to consider:  (a) OCPs for ovarian cancer risk reduction  (  b) tamoxifen for breast cancer risk reduction  (c) consider bilateral mastectomies versus continuing intensified screening when done with nursing  (d) consider BSO by age 84  (14) severe preeclampsia with April 2018 delivery   PLAN:  We have once again discussed about continuing MRIs alternating with mammogram for breast cancer intensified screening given BRCA1 mutation.  I have ordered her MRI breast today.  Will postpone mammograms to July given that slight delay in MRI.  No concerning findings on exam today.  She is still wondering if she wants to have another child.  She follows up with Dr. Ronita Hipps from gynecology for ovarian cancer screening.  She has a follow-up ultrasound for some ovarian cyst. She is otherwise doing well.  No concerns for skin cancers.  She is up-to-date with her dermatology visits.  Total time spent: 30 minutes *Total Encounter Time as defined by the Centers for Medicare and Medicaid Services includes, in addition to the face-to-face time of a patient visit (documented in the note above) non-face-to-face time: obtaining and reviewing outside history, ordering and reviewing medications, tests or procedures, care coordination (communications with other health care professionals or caregivers)  and documentation in the medical record.

## 2022-12-18 DIAGNOSIS — Z1501 Genetic susceptibility to malignant neoplasm of breast: Secondary | ICD-10-CM | POA: Diagnosis not present

## 2022-12-18 DIAGNOSIS — Z803 Family history of malignant neoplasm of breast: Secondary | ICD-10-CM | POA: Diagnosis not present

## 2022-12-18 DIAGNOSIS — N83209 Unspecified ovarian cyst, unspecified side: Secondary | ICD-10-CM | POA: Diagnosis not present

## 2022-12-18 DIAGNOSIS — N939 Abnormal uterine and vaginal bleeding, unspecified: Secondary | ICD-10-CM | POA: Diagnosis not present

## 2022-12-25 ENCOUNTER — Other Ambulatory Visit: Payer: BC Managed Care – PPO

## 2023-01-08 ENCOUNTER — Other Ambulatory Visit: Payer: Self-pay | Admitting: Adult Health

## 2023-01-08 ENCOUNTER — Ambulatory Visit
Admission: RE | Admit: 2023-01-08 | Discharge: 2023-01-08 | Disposition: A | Discharge status: Home / Self Care | Payer: BC Managed Care – PPO | Source: Ambulatory Visit | Attending: Hematology and Oncology | Admitting: Hematology and Oncology

## 2023-01-08 DIAGNOSIS — N63 Unspecified lump in unspecified breast: Secondary | ICD-10-CM

## 2023-01-08 DIAGNOSIS — Z1239 Encounter for other screening for malignant neoplasm of breast: Secondary | ICD-10-CM | POA: Diagnosis not present

## 2023-01-08 DIAGNOSIS — Z1501 Genetic susceptibility to malignant neoplasm of breast: Secondary | ICD-10-CM

## 2023-01-08 MED ORDER — GADOPICLENOL 0.5 MMOL/ML IV SOLN
7.0000 mL | Freq: Once | INTRAVENOUS | Status: AC | PRN
  Administered 2023-01-08: 7 mL via INTRAVENOUS

## 2023-01-08 NOTE — Progress Notes (Signed)
I reviewed patient's breast MRI with her and the recommendation for left lower outer breast biopsy.  Patient verbalized understanding and I have ordered the biopsy to go ahead and be scheduled.    Wilber Bihari, NP 01/08/23 1:39 PM Medical Oncology and Hematology [facility] [place], [place] [address] Tel. [phone]    Fax. [phone]

## 2023-01-18 ENCOUNTER — Ambulatory Visit
Admission: RE | Admit: 2023-01-18 | Discharge: 2023-01-18 | Disposition: A | Discharge status: Home / Self Care | Payer: BC Managed Care – PPO | Source: Ambulatory Visit | Attending: Adult Health | Admitting: Adult Health

## 2023-01-18 DIAGNOSIS — N63 Unspecified lump in unspecified breast: Secondary | ICD-10-CM

## 2023-01-18 DIAGNOSIS — N62 Hypertrophy of breast: Secondary | ICD-10-CM | POA: Diagnosis not present

## 2023-01-18 DIAGNOSIS — R928 Other abnormal and inconclusive findings on diagnostic imaging of breast: Secondary | ICD-10-CM | POA: Diagnosis not present

## 2023-01-18 MED ORDER — GADOPICLENOL 0.5 MMOL/ML IV SOLN
8.0000 mL | Freq: Once | INTRAVENOUS | Status: AC | PRN
  Administered 2023-01-18: 8 mL via INTRAVENOUS

## 2023-01-19 ENCOUNTER — Telehealth: Payer: Self-pay

## 2023-01-19 DIAGNOSIS — B300 Keratoconjunctivitis due to adenovirus: Secondary | ICD-10-CM | POA: Diagnosis not present

## 2023-01-19 NOTE — Telephone Encounter (Signed)
Pt called regarding MRI bx results, asking if these results are the same as her past MRI bx of [place]. Advised pt I would consults with MD about making her a telephone visit to further review

## 2023-01-20 ENCOUNTER — Inpatient Hospital Stay: Payer: BC Managed Care – PPO | Attending: Hematology and Oncology | Admitting: Hematology and Oncology

## 2023-01-20 ENCOUNTER — Encounter: Payer: Self-pay | Admitting: Hematology and Oncology

## 2023-01-20 DIAGNOSIS — Z1509 Genetic susceptibility to other malignant neoplasm: Secondary | ICD-10-CM

## 2023-01-20 DIAGNOSIS — Z1501 Genetic susceptibility to malignant neoplasm of breast: Secondary | ICD-10-CM

## 2023-01-20 DIAGNOSIS — N6489 Other specified disorders of breast: Secondary | ICD-10-CM | POA: Diagnosis not present

## 2023-01-20 NOTE — Progress Notes (Signed)
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  Patient was wondering if it is reasonable to consider discussing with the surgeons about any action needed for Leah Davis vs considering risk reduction bilateral mastectomy.  She is otherwise without any health issues currently.  COVID 19 VACCINATION STATUS: Status post vaccine x2 as of December 2022  FAMILY BREAST/OVARIAN CANCER HISTORY: The patient's mother was diagnosed with breast cancer at age 31, and her maternal grandmother also with breast cancer around age 1. The patient's mother was my patient Gaylyn Lambert. Ernestene herself was tested in September 2013 and was found to carry the familial mutation,in BRCA1, namely 187delAG.. This is one of the common Ashkenazi mutations.   PAST MEDICAL HISTORY: Past Medical History:  Diagnosis Date   Anxiety    BRCA1 positive 2014   Condyloma acuminata 05/2012   see 06/17/2012 note.   Migraine    WITHOUT AURA   PMS (premenstrual syndrome)    Postpartum care following vaginal delivery (7/25) 06/23/2014   Pregnancy 02/24/2014    PAST SURGICAL HISTORY: Past Surgical History:  Procedure Laterality Date   BREAST BIOPSY Right 12/30/2017   BREAST BIOPSY Left 09/16/2020   ivf     WISDOM TOOTH EXTRACTION      FAMILY HISTORY Family History  Problem Relation Age of Onset   Breast cancer Mother 77   Breast cancer Maternal  Grandmother        diagnosed age 38-44   Rheum arthritis Father    Cancer Paternal Grandfather     GYNECOLOGIC HISTORY:  No LMP recorded. GX P2.  As of December 2022 Leah Davis is still having regular periods.  She and her husband have 4 frozen embryos available.  However she did have significant preeclampsia with pregnancies previously.  Currently they are not using any contraception.  If she becomes pregnant they may release the embryos.  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  Their 2 children are 18 and 52 years old as of December 2022.  The patient attends a [employer]    ADVANCED DIRECTIVES: In the absence of any documents to the contrary the patient's husband is her healthcare power of attorney   HEALTH MAINTENANCE: Social History   Tobacco Use   Smoking status: Never   Smokeless tobacco: Never  Substance Use Topics   Alcohol use: Yes    Comment: occassionally   Drug use: No     Colonoscopy: n/a  PAP:  Bone density: n/a  Lipid panel:  No Known Allergies  Current Outpatient Medications  Medication Sig Dispense Refill   ALPRAZolam (XANAX) 0.5 MG tablet Take 1 tablet (0.5 mg total) by mouth at bedtime as needed for anxiety.  0   FLUoxetine (PROZAC) 20 MG capsule Take 1 capsule (20 mg total) by mouth daily. 30 capsule  3   Multiple Vitamin (MULTIVITAMIN) capsule Take 1 capsule by mouth daily.     rizatriptan (MAXALT) 5 MG tablet Take 1 tablet (5 mg total) by mouth as needed for migraine. May repeat in 2 hours if needed  0   No current facility-administered medications for this visit.    OBJECTIVE: white woman who appears well  There were no vitals filed for this visit.     There is no height or weight on file to calculate BMI.    ECOG FS:0 -  Asymptomatic  Physical examination not done, telephone visit   LAB RESULTS:  CMP     Component Value Date/Time   NA 138 04/28/2017 1022   K 4.7 04/28/2017 1022   CL 107 03/15/2017 0526   CL 107 12/16/2012 1449   CO2 25 04/28/2017 1022   GLUCOSE 81 04/28/2017 1022   GLUCOSE 121 (H) 12/16/2012 1449   BUN 13.7 04/28/2017 1022   CREATININE 0.8 04/28/2017 1022   CALCIUM 9.4 04/28/2017 1022   PROT 6.9 04/28/2017 1022   ALBUMIN 4.3 04/28/2017 1022   AST 16 04/28/2017 1022   ALT 19 04/28/2017 1022   ALKPHOS 78 04/28/2017 1022   BILITOT 0.40 04/28/2017 1022   GFRNONAA >60 03/15/2017 0526   GFRAA >60 03/15/2017 0526    INo results found for: "SPEP", "UPEP"  Lab Results  Component Value Date   WBC 6.1 04/28/2017   NEUTROABS 3.2 04/28/2017   HGB 12.2 04/28/2017   HCT 37.4 04/28/2017   MCV 89.0 04/28/2017   PLT 221 04/28/2017      Chemistry      Component Value Date/Time   NA 138 04/28/2017 1022   K 4.7 04/28/2017 1022   CL 107 03/15/2017 0526   CL 107 12/16/2012 1449   CO2 25 04/28/2017 1022   BUN 13.7 04/28/2017 1022   CREATININE 0.8 04/28/2017 1022      Component Value Date/Time   CALCIUM 9.4 04/28/2017 1022   ALKPHOS 78 04/28/2017 1022   AST 16 04/28/2017 1022   ALT 19 04/28/2017 1022   BILITOT 0.40 04/28/2017 1022       No results found for: "LABCA2"  No components found for: "LABCA125"  No results for input(s): "INR" in the last 168 hours.  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 This was found to be concordant by Dr. Lillia Mountain. Pathology results  were discussed with the patient by telephone. The patient reported doing well after the biopsy with tenderness at the site. Post biopsy instructions and care were reviewed and questions were answered. The patient was encouraged to call [facility] for any additional concerns. Bilateral breast MRI recommended in 6 months per protocol. Pathology results reported by Stacie Acres RN on 01/19/2023. Electronically Signed   By: Lillia Mountain M.D.   On: 01/20/2023 08:09   Result Date: 01/20/2023 CLINICAL DATA:  Indeterminate non masslike enhancement in the lower outer quadrant of the left breast. EXAM: MRI GUIDED CORE NEEDLE BIOPSY OF THE LEFT BREAST TECHNIQUE: Multiplanar, multisequence MR imaging of the left breast was performed both before and after administration of intravenous contrast. CONTRAST:  8 mL of Vueway COMPARISON:  Previous exam(s). FINDINGS: I met with the patient, and we discussed the procedure of MRI guided biopsy, including risks, benefits, and alternatives. Specifically, we discussed the risks of infection, bleeding, tissue injury, clip migration, and inadequate sampling. Informed, written consent was given. The usual time out protocol was performed immediately prior to the procedure. Using sterile technique, 1% Lidocaine, MRI guidance, and a 9 gauge vacuum assisted device, biopsy was performed of non masslike enhancement in the lower outer quadrant of the left breast using a lateral to medial approach. At the conclusion of the procedure, a cylinder shaped tissue marker clip was deployed into the biopsy cavity. Follow-up 2-view mammogram was performed and dictated separately. IMPRESSION: MRI guided biopsy of the left breast.  No apparent complications. Electronically Signed: By: Lillia Mountain M.D. On: 01/18/2023 09:57   MM CLIP PLACEMENT LEFT  Result Date: 01/18/2023 CLINICAL DATA:  Status post MR guided core biopsy of the left breast. EXAM: 3D DIAGNOSTIC LEFT MAMMOGRAM POST  MRI BIOPSY COMPARISON:  Previous exam(s). FINDINGS: 3D Mammographic images were obtained following MR guided biopsy of the left breast. The biopsy marking clip is in expected location in the lower outer quadrant of the left breast. IMPRESSION: Status post MR guided core biopsy of the left breast. The cylinder-shaped clip is in appropriate position in the lower slightly outer aspect of the left breast. Final Assessment: Post Procedure Mammograms for Marker Placement Electronically Signed   By: Lillia Mountain M.D.   On: 01/18/2023 10:04  MR BREAST BILATERAL W WO CONTRAST INC CAD  Result Date: 01/08/2023 CLINICAL DATA:  Elevated lifetime risk of breast cancer. History of BRCA 1 mutation. EXAM: BILATERAL BREAST MRI WITH AND WITHOUT CONTRAST TECHNIQUE: Multiplanar, multisequence MR images of both breasts were obtained prior to and following the intravenous administration of 7 ml of Vueway Three-dimensional MR images were rendered by post-processing of the original MR data on an independent workstation. The three-dimensional MR images were interpreted, and findings are reported in the following complete MRI report for this study. Three dimensional images were evaluated at the independent interpreting workstation using the DynaCAD thin client. COMPARISON:  Previous exam(s). FINDINGS: Breast composition: c. Heterogeneous fibroglandular tissue. Background parenchymal enhancement: Mild Right breast: No mass or abnormal enhancement. Susceptibility artifact from biopsy marking clip. Left breast: Within the lower outer left breast there is a 1.5 x 1.2 cm focal area of non mass enhancement (image 111; series 12). No additional areas of enhancement identified within the left breast. Susceptibility artifact identified within the upper-outer left breast at the site of prior biopsy. Similar-appearing previously biopsied mass within the upper-outer left breast posterior depth. Lymph nodes: No abnormal appearing lymph nodes. Ancillary  findings:  None. IMPRESSION: 1. Indeterminate 1.5 cm focal area of non mass enhancement within the lower outer left breast. 2. No MRI evidence of malignancy within the right breast. RECOMMENDATION: MRI guided core needle biopsy of the focal area of non mass enhancement lower outer left breast. BI-RADS CATEGORY  4: Suspicious. Electronically Signed   By: Lovey Newcomer M.D.   On: 01/08/2023 12:14    ASSESSMENT: 37 y.o.  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  (2) intensified screening:  (a) yearly MRI alternating with mammogram/tomography  (b) consider bi-annual TVUS day 1-10 of cycle starting age 40  (34) once done with family planning, to consider:  (a) OCPs for ovarian cancer risk reduction  (b) tamoxifen  for breast cancer risk reduction  (c) consider bilateral mastectomies versus continuing intensified screening when done with nursing  (d) consider BSO by age 50  (51) severe preeclampsia with April 2018 delivery   PLAN: We have today discussed about at least considering referral to breast surgery to discuss role of breast surgery for PASH. We discussed that this is usually a benign proliferation. At times, we recommend surgery if there is growth, atypical features or symptoms related to it. At this time there appears to be no atypical features noted on imaging or progression in size however she has had [place] in a different location and she is concerned about having it again in a different location.  She also reports some pressure in this area.  Given her BRCA1 mutation, I think it is reasonable to consider discussion with surgery and take it from there.  She is agreeable with this.  Referral placed in the system.  This will be faxed to breast surgery team.  Total time spent: 12 minutes   I connected with  Ilean China Wilbanks on 01/20/23 by a telephone application and verified  that I am speaking with the correct person using two identifiers.   I discussed the limitations of evaluation and management by telemedicine. The patient expressed understanding and agreed to proceed.   *Total Encounter Time as defined by the Centers for Medicare and Medicaid Services includes, in addition to the face-to-face time of a patient visit (documented in the note above) non-face-to-face time: obtaining and reviewing outside history, ordering and reviewing medications, tests or procedures, care coordination (communications with other health care professionals or caregivers) and documentation in the medical record.

## 2023-01-21 ENCOUNTER — Other Ambulatory Visit: Payer: Self-pay | Admitting: Adult Health

## 2023-01-21 ENCOUNTER — Telehealth: Payer: Self-pay | Admitting: *Deleted

## 2023-01-21 DIAGNOSIS — Z1501 Genetic susceptibility to malignant neoplasm of breast: Secondary | ICD-10-CM

## 2023-01-21 DIAGNOSIS — R928 Other abnormal and inconclusive findings on diagnostic imaging of breast: Secondary | ICD-10-CM

## 2023-01-21 NOTE — Telephone Encounter (Signed)
This RN received VM from pt inquiring about telephone call yesterday from MD regarding results is a charged visit. She noted on My Chart that it is noted as a visit.  This RN sent email to Oncology director per above as well as left VM per pt's request on identified.

## 2023-01-21 NOTE — Progress Notes (Signed)
Patient underwent breast MRI on January 08, 2023 demonstrating an indeterminant 1.5 cm focal area of non-mass enhancement within the lower outer left breast.  There is no evidence of malignancy in the right breast.  She underwent MRI guided biopsy on January 18, 2023 and results were available yesterday morning.  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  The breast center discussed these results with the patient.  Repeat bilateral breast MRI was recommended to be completed in 6 months per protocol.  I placed orders for this today.  Wilber Bihari, NP 01/21/23 10:32 AM Medical Oncology and Hematology [facility] [place], [place] [address] Tel. [phone]    Fax. [phone]

## 2023-01-27 ENCOUNTER — Telehealth: Payer: Self-pay | Admitting: Hematology and Oncology

## 2023-01-27 NOTE — Telephone Encounter (Signed)
Scheduled appointment per patients availability. Patient is aware of appointment details.

## 2023-02-11 DIAGNOSIS — N939 Abnormal uterine and vaginal bleeding, unspecified: Secondary | ICD-10-CM | POA: Diagnosis not present

## 2023-02-18 NOTE — Progress Notes (Signed)
Patient Care Team: Chesley Noon, MD as PCP - General (Family Medicine) Brien Few, MD as Consulting Physician (Obstetrics and Gynecology)  DIAGNOSIS:  Encounter Diagnosis  Name Primary?   BRCA1 positive Yes    CHIEF COMPLIANT: BRCA-1 mutation carrier/ Establish oncology care with Dr. Lindi Adie   INTERVAL HISTORY: Leah Davis is a 37 y.o with the above mentioned BRCA-1 mutation carrier. She presents to the clinic for a follow-up and Establish oncology care with Dr. Lindi Adie. She has had Giles in a different location and she is concerned about having it again in a different location. She has some concern about if PASH was going to keep coming back. Occasionally she gets pain on left breast around 1 of the clips. She doesn't know if it is scar tissue. She wanted to know the chances of getting a mastectomy.     ALLERGIES:  has No Known Allergies.  MEDICATIONS:  Current Outpatient Medications  Medication Sig Dispense Refill   ALPRAZolam (XANAX) 0.5 MG tablet Take 1 tablet (0.5 mg total) by mouth at bedtime as needed for anxiety.  0   FLUoxetine (PROZAC) 20 MG capsule Take 1 capsule (20 mg total) by mouth daily. 30 capsule 3   Multiple Vitamin (MULTIVITAMIN) capsule Take 1 capsule by mouth daily.     rizatriptan (MAXALT) 5 MG tablet Take 1 tablet (5 mg total) by mouth as needed for migraine. May repeat in 2 hours if needed  0   No current facility-administered medications for this visit.    PHYSICAL EXAMINATION: ECOG PERFORMANCE STATUS: 1 - Symptomatic but completely ambulatory  Vitals:   02/19/23 0814  BP: (!) 168/86  Pulse: (!) 101  Resp: 18  Temp: 97.8 F (36.6 C)  SpO2: 100%   Filed Weights   02/19/23 0814  Weight: 175 lb 8 oz (79.6 kg)     LABORATORY DATA:  I have reviewed the data as listed    Latest Ref Rng & Units 04/28/2017   10:22 AM 03/15/2017    5:26 AM 03/14/2017    9:12 AM  CMP  Glucose 70 - 140 mg/dl 81  77  90   BUN 7.0 - 26.0 mg/dL  13.7  18  14    Creatinine 0.6 - 1.1 mg/dL 0.8  0.63  0.52   Sodium 136 - 145 mEq/L 138  137  134   Potassium 3.5 - 5.1 mEq/L 4.7  4.3  4.0   Chloride 101 - 111 mmol/L  107  103   CO2 22 - 29 mEq/L 25  21  22    Calcium 8.4 - 10.4 mg/dL 9.4  8.8  7.6   Total Protein 6.4 - 8.3 g/dL 6.9  6.8  6.8   Total Bilirubin 0.20 - 1.20 mg/dL 0.40  0.5  0.1   Alkaline Phos 40 - 150 U/L 78  130  138   AST 5 - 34 U/L 16  24  35   ALT 0 - 55 U/L 19  52  69     Lab Results  Component Value Date   WBC 6.1 04/28/2017   HGB 12.2 04/28/2017   HCT 37.4 04/28/2017   MCV 89.0 04/28/2017   PLT 221 04/28/2017   NEUTROABS 3.2 04/28/2017    ASSESSMENT & PLAN:  BRCA1 positive 01/18/2023: 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 It may present as a mass or thickening on exam and commonly found  on mammograms as a well-defined mass. Excisional biopsy should be performed for most of these cases. However if was diagnosed without any mass then surgical excision is not necessary. There is no increased risk of subsequent breast cancer associated with PASH   BRCA1 mutation carrier: I recommended that she undergo prophylactic bilateral mastectomies and I made referrals for the patient to see Dr. Donne Hazel and Dr. Iran Planas.  We discussed the pros and cons of antiestrogen therapy and because of her heavy menstrual cycles, we decided that it was not in her best interest.  Breast cancer surveillance: Mammograms 03/10/2022: Benign breast density category B Breast MRI 01/08/2023: Indeterminate 1.5 cm area of focal non-mass enhancement LOQ left breast.  Biopsy: PASH If she does undergo bilateral mastectomies there is no further breast cancer surveillance necessary from our standpoint. She plans to undergo oophorectomy when she gets closer to age 46.    No orders of the defined types were placed in this encounter.  The  patient has a good understanding of the overall plan. she agrees with it. she will call with any problems that may develop before the next visit here. Total time spent: 30 mins including face to face time and time spent for planning, charting and co-ordination of care   Harriette Ohara, MD 02/19/23    I Gardiner Coins am acting as a Education administrator for [employer]  I have reviewed the above documentation for accuracy and completeness, and I agree with the above.

## 2023-02-19 ENCOUNTER — Inpatient Hospital Stay: Payer: BC Managed Care – PPO | Attending: Hematology and Oncology | Admitting: Hematology and Oncology

## 2023-02-19 VITALS — BP 168/86 | HR 101 | Temp 97.8°F | Resp 18 | Ht 66.0 in | Wt 175.5 lb

## 2023-02-19 DIAGNOSIS — Z1509 Genetic susceptibility to other malignant neoplasm: Secondary | ICD-10-CM | POA: Diagnosis not present

## 2023-02-19 DIAGNOSIS — Z7183 Encounter for nonprocreative genetic counseling: Secondary | ICD-10-CM

## 2023-02-19 DIAGNOSIS — Z1501 Genetic susceptibility to malignant neoplasm of breast: Secondary | ICD-10-CM | POA: Diagnosis not present

## 2023-02-19 NOTE — Assessment & Plan Note (Addendum)
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 It may present as a mass or thickening on exam and commonly found on mammograms as a well-defined mass. Excisional biopsy should be performed for most of these cases. However if was diagnosed without any mass then surgical excision is not necessary. There is no increased risk of subsequent breast cancer associated with PASH   BRCA1 mutation carrier: I recommended that she undergo prophylactic bilateral mastectomies and I made referrals for the patient to see Dr. Donne Hazel and Dr. Iran Planas.  Breast cancer surveillance: Mammograms 03/10/2022: Benign breast density category B Breast MRI 01/08/2023: Indeterminate 1.5 cm area of focal non-mass enhancement LOQ left breast.  Biopsy: [place] Return to clinic on an as-needed basis.

## 2023-03-12 DIAGNOSIS — D649 Anemia, unspecified: Secondary | ICD-10-CM | POA: Diagnosis not present

## 2023-03-19 DIAGNOSIS — D225 Melanocytic nevi of trunk: Secondary | ICD-10-CM | POA: Diagnosis not present

## 2023-03-19 DIAGNOSIS — D2230 Melanocytic nevi of unspecified part of face: Secondary | ICD-10-CM | POA: Diagnosis not present

## 2023-03-19 DIAGNOSIS — D1801 Hemangioma of skin and subcutaneous tissue: Secondary | ICD-10-CM | POA: Diagnosis not present

## 2023-03-19 DIAGNOSIS — L814 Other melanin hyperpigmentation: Secondary | ICD-10-CM | POA: Diagnosis not present

## 2023-03-19 DIAGNOSIS — D239 Other benign neoplasm of skin, unspecified: Secondary | ICD-10-CM | POA: Diagnosis not present

## 2023-03-31 ENCOUNTER — Other Ambulatory Visit: Payer: Self-pay | Admitting: Hematology and Oncology

## 2023-03-31 DIAGNOSIS — Z1501 Genetic susceptibility to malignant neoplasm of breast: Secondary | ICD-10-CM

## 2023-03-31 DIAGNOSIS — Z1231 Encounter for screening mammogram for malignant neoplasm of breast: Secondary | ICD-10-CM

## 2023-04-16 ENCOUNTER — Ambulatory Visit
Admission: RE | Admit: 2023-04-16 | Discharge: 2023-04-16 | Disposition: A | Discharge status: Home / Self Care | Payer: BC Managed Care – PPO | Source: Ambulatory Visit | Attending: Hematology and Oncology | Admitting: Hematology and Oncology

## 2023-04-16 ENCOUNTER — Other Ambulatory Visit: Payer: Self-pay | Admitting: Hematology and Oncology

## 2023-04-16 DIAGNOSIS — N644 Mastodynia: Secondary | ICD-10-CM | POA: Diagnosis not present

## 2023-04-16 DIAGNOSIS — Z1501 Genetic susceptibility to malignant neoplasm of breast: Secondary | ICD-10-CM

## 2023-04-16 DIAGNOSIS — R92333 Mammographic heterogeneous density, bilateral breasts: Secondary | ICD-10-CM | POA: Diagnosis not present

## 2023-04-28 DIAGNOSIS — Z1501 Genetic susceptibility to malignant neoplasm of breast: Secondary | ICD-10-CM | POA: Diagnosis not present

## 2023-04-28 DIAGNOSIS — C50911 Malignant neoplasm of unspecified site of right female breast: Secondary | ICD-10-CM | POA: Diagnosis not present

## 2023-05-13 DIAGNOSIS — Z1509 Genetic susceptibility to other malignant neoplasm: Secondary | ICD-10-CM | POA: Diagnosis not present

## 2023-05-13 DIAGNOSIS — N6481 Ptosis of breast: Secondary | ICD-10-CM | POA: Diagnosis not present

## 2023-05-13 DIAGNOSIS — N641 Fat necrosis of breast: Secondary | ICD-10-CM | POA: Diagnosis not present

## 2023-05-13 DIAGNOSIS — Z1501 Genetic susceptibility to malignant neoplasm of breast: Secondary | ICD-10-CM | POA: Diagnosis not present

## 2023-05-14 ENCOUNTER — Other Ambulatory Visit: Payer: Self-pay | Admitting: General Surgery

## 2023-05-21 ENCOUNTER — Ambulatory Visit: Payer: BC Managed Care – PPO

## 2023-06-28 ENCOUNTER — Telehealth: Payer: Self-pay

## 2023-06-28 NOTE — Telephone Encounter (Signed)
Pt called and states she spoke with 2 nurses last week about an abbreviated MRI and new CT MM that may be cheaper than what she is scheduled for. MD does not recommend this in her case since she has PASH and BRCA1 mutation. Pt states she will move forward with bilateral MRI as ordered and she plans to have a double mastectomy in January 2025.

## 2023-07-02 ENCOUNTER — Telehealth: Payer: Self-pay | Admitting: Adult Health

## 2023-07-02 ENCOUNTER — Ambulatory Visit
Admission: RE | Admit: 2023-07-02 | Discharge: 2023-07-02 | Disposition: A | Discharge status: Home / Self Care | Payer: BC Managed Care – PPO | Source: Ambulatory Visit | Attending: Adult Health | Admitting: Adult Health

## 2023-07-02 DIAGNOSIS — Z1239 Encounter for other screening for malignant neoplasm of breast: Secondary | ICD-10-CM | POA: Diagnosis not present

## 2023-07-02 DIAGNOSIS — R928 Other abnormal and inconclusive findings on diagnostic imaging of breast: Secondary | ICD-10-CM

## 2023-07-02 DIAGNOSIS — Z1501 Genetic susceptibility to malignant neoplasm of breast: Secondary | ICD-10-CM

## 2023-07-02 MED ORDER — GADOPICLENOL 0.5 MMOL/ML IV SOLN
8.0000 mL | Freq: Once | INTRAVENOUS | Status: AC | PRN
  Administered 2023-07-02: 8 mL via INTRAVENOUS

## 2023-07-02 NOTE — Telephone Encounter (Signed)
-----   Message from Noreene Filbert sent at 07/02/2023 11:59 AM EDT ----- MRI negative for any breast concerns, please notify patient ----- Message ----- From: Interface, Rad Results In Sent: 07/02/2023  11:59 AM EDT To: Loa Socks, NP

## 2023-07-02 NOTE — Telephone Encounter (Signed)
Per Lillard Anes, DNP, called pt with message below and left vmail on pt personal cell. Advised to call office if there are any concerns.

## 2023-07-06 ENCOUNTER — Telehealth: Payer: Self-pay

## 2023-07-06 NOTE — Telephone Encounter (Signed)
Pt called with concern for billing of a visit. Called pt back and LVM for call back.

## 2023-07-23 DIAGNOSIS — E611 Iron deficiency: Secondary | ICD-10-CM | POA: Diagnosis not present

## 2023-07-23 DIAGNOSIS — Z13220 Encounter for screening for lipoid disorders: Secondary | ICD-10-CM | POA: Diagnosis not present

## 2023-07-23 DIAGNOSIS — Z0000 Encounter for general adult medical examination without abnormal findings: Secondary | ICD-10-CM | POA: Diagnosis not present

## 2023-07-26 DIAGNOSIS — N939 Abnormal uterine and vaginal bleeding, unspecified: Secondary | ICD-10-CM | POA: Diagnosis not present

## 2023-09-06 DIAGNOSIS — Z03818 Encounter for observation for suspected exposure to other biological agents ruled out: Secondary | ICD-10-CM | POA: Diagnosis not present

## 2023-09-06 DIAGNOSIS — R509 Fever, unspecified: Secondary | ICD-10-CM | POA: Diagnosis not present

## 2023-09-06 DIAGNOSIS — J069 Acute upper respiratory infection, unspecified: Secondary | ICD-10-CM | POA: Diagnosis not present

## 2023-09-10 DIAGNOSIS — J209 Acute bronchitis, unspecified: Secondary | ICD-10-CM | POA: Diagnosis not present

## 2023-09-21 DIAGNOSIS — Z03818 Encounter for observation for suspected exposure to other biological agents ruled out: Secondary | ICD-10-CM | POA: Diagnosis not present

## 2023-09-23 DIAGNOSIS — B300 Keratoconjunctivitis due to adenovirus: Secondary | ICD-10-CM | POA: Diagnosis not present

## 2023-10-01 ENCOUNTER — Other Ambulatory Visit: Payer: BC Managed Care – PPO

## 2023-10-08 DIAGNOSIS — Z1501 Genetic susceptibility to malignant neoplasm of breast: Secondary | ICD-10-CM | POA: Diagnosis not present

## 2023-10-08 DIAGNOSIS — Z1509 Genetic susceptibility to other malignant neoplasm: Secondary | ICD-10-CM | POA: Diagnosis not present

## 2023-10-08 DIAGNOSIS — Z803 Family history of malignant neoplasm of breast: Secondary | ICD-10-CM | POA: Diagnosis not present

## 2023-10-14 DIAGNOSIS — Z01419 Encounter for gynecological examination (general) (routine) without abnormal findings: Secondary | ICD-10-CM | POA: Diagnosis not present

## 2023-10-14 DIAGNOSIS — Z1331 Encounter for screening for depression: Secondary | ICD-10-CM | POA: Diagnosis not present

## 2023-10-14 DIAGNOSIS — Z124 Encounter for screening for malignant neoplasm of cervix: Secondary | ICD-10-CM | POA: Diagnosis not present

## 2023-10-19 ENCOUNTER — Other Ambulatory Visit: Payer: Self-pay | Admitting: General Surgery

## 2023-10-22 ENCOUNTER — Other Ambulatory Visit: Payer: BC Managed Care – PPO

## 2023-12-01 DIAGNOSIS — C801 Malignant (primary) neoplasm, unspecified: Secondary | ICD-10-CM

## 2023-12-01 HISTORY — DX: Malignant (primary) neoplasm, unspecified: C80.1

## 2023-12-09 DIAGNOSIS — Z1509 Genetic susceptibility to other malignant neoplasm: Secondary | ICD-10-CM | POA: Diagnosis not present

## 2023-12-09 DIAGNOSIS — Z1501 Genetic susceptibility to malignant neoplasm of breast: Secondary | ICD-10-CM | POA: Diagnosis not present

## 2023-12-09 NOTE — H&P (Signed)
 ubjective Patient ID: Leah Davis is a 38 y.o. female.     HPI   Returns for follow up discussion breast reconstruction, last seen 05/2023. Diagnosed with BRCA1 mutation 2013 following mother's diagnosis of BRCA1 mutation. Mother and MGM with breast ca. She has discussed bilateral nipple sparing risk reducing mastectomies with Dr. Ebbie. Mother had abdominal based reconstruction.    MRI 12/2022 demonstrated a 1.5 cm area NME lower outer left breast. MR guided biopsy showed PASH and 6 month follow up MRI recommended. This was completed 8.24 and was benign.   MMG bilateral diagnostic/left US  03/2023 benign. 6 mo f/u left diagnostic MMG recommended (10/2023)   Current 36 B, desires bit larger. Wt stable.   Works as armed forces operational officer. Lives with spouse and 2 kids. Thinks they are done having kids. Completed IVF in past and still has embryos.    Review of Systems  All other systems reviewed and are negative.     Objective Physical Exam  Cardiovascular: Normal rate, regular rhythm and normal heart sounds.    Pulmonary/Chest Effort normal and breath sounds normal.    Skin   Fitzpatrick 2    Lymph: no adenopathy axillary palpable   Abd: sufficient volume for small reconstruction   Breasts: no masses Right breast pseudoptosis, left breast no ptosis Right volume larger than left SN to nipple R 23 L 23. 5 cm BW R 17 L 16 cm CW 12 cm Nipple to IMF R 8 L 8 cm   Assessment/Plan BRCA1 positive   Repeat 6 mo diagnostic MMG recommended for November 2024 not completed; no plans for this per patient given intervening normal MRI and plan for mastectomies.   Plan bilateral NSM with immediate TE acellular dermis reconstruction.    Reviewed incisions, drains, OR length, hospital stay and recovery for each. Discussed process of expansion and implant based risks including rupture, MRI surveillance for silicone implants, infection requiring surgery or removal, contracture. Discussed TRAM  vs DIEP, latter would require treatment at tertiary center. Discussed abdominal based complications including failure flap, abdominal bulge or hernia. Discussed future surgery dependent on adjuvant treatments.   Reviewed SSM vs NSM, both will be asensate and not stimulate. Reviewed with both risks mastectomy flap necrosis requiring additional surgery. Reviewed the base line asymmetries of her breasts may persist post reconstruction. Discussed TE placement at time of mastectomy does not preclude autologous reconstruction in future.   Discussed use of acellular dermis in reconstruction, cadaveric source, incorporation over several weeks, risk that if has seroma or infection can act as additional nidus for infection if not incorporated. Reviewed this is off label use of ADM.   Discussed prepectoral vs sub pectoral reconstruction. Discussed with patient and benefit of this is no animation deformity, may be less pain. Risk may be more visible rippling over upper poles, greater need of ADM. Reviewed pre pectoral would require larger amount acellular dermis, more drains. Discussed any type reconstruction also risks long term displacement implant and visible rippling. If prepectoral counseled I would recommend she be comfortable with silicone implants as more options that have less rippling. She agrees to prepectoral placement.   Additional risks including but not limited to bleeding, seroma, hematoma, damage to adjacent structures, infection, asymmetry, damage to adjacent structures, need for additional procedures, unacceptable cosmetic result, blood clots in legs or lungs reviewed.    Drain teaching completed. Rx for oxycodone  robaxin  and Bactrim  given. Rx for [place] to [place] given.    Earlis Ranks, MD [facility] Plastic &  Reconstructive Surgery  Office/ physician access line after hours [phone]

## 2023-12-16 ENCOUNTER — Other Ambulatory Visit: Payer: Self-pay

## 2023-12-16 ENCOUNTER — Encounter (HOSPITAL_BASED_OUTPATIENT_CLINIC_OR_DEPARTMENT_OTHER): Payer: Self-pay | Admitting: General Surgery

## 2023-12-17 ENCOUNTER — Ambulatory Visit: Payer: BC Managed Care – PPO | Admitting: Hematology and Oncology

## 2023-12-17 MED ORDER — ENSURE PRE-SURGERY PO LIQD: 296.0000 mL | Freq: Once | ORAL | Status: DC

## 2023-12-17 MED ORDER — CHLORHEXIDINE GLUCONATE CLOTH 2 % EX PADS: 6.0000 | MEDICATED_PAD | Freq: Once | CUTANEOUS | Status: DC

## 2023-12-17 NOTE — Progress Notes (Signed)

## 2023-12-26 NOTE — Anesthesia Preprocedure Evaluation (Signed)
Anesthesia Evaluation  Patient identified by MRN, date of birth, ID band Patient awake    Reviewed: Allergy & Precautions, H&P , NPO status , Patient's Chart, lab work & pertinent test results  Airway Mallampati: II  TM Distance: >3 FB Neck ROM: Full    Dental no notable dental hx.    Pulmonary neg pulmonary ROS   Pulmonary exam normal breath sounds clear to auscultation       Cardiovascular Exercise Tolerance: Good negative cardio ROS Normal cardiovascular exam Rhythm:Regular Rate:Normal     Neuro/Psych  Headaches  Anxiety     negative neurological ROS  negative psych ROS   GI/Hepatic negative GI ROS, Neg liver ROS,,,  Endo/Other  negative endocrine ROS    Renal/GU negative Renal ROS  negative genitourinary   Musculoskeletal negative musculoskeletal ROS (+)    Abdominal   Peds negative pediatric ROS (+)  Hematology negative hematology ROS (+)   Anesthesia Other Findings   Reproductive/Obstetrics negative OB ROS                              Anesthesia Physical Anesthesia Plan  ASA: 2  Anesthesia Plan: General   Post-op Pain Management: Minimal or no pain anticipated, Tylenol PO (pre-op)*, Celebrex PO (pre-op)* and Dilaudid IV   Induction: Intravenous  PONV Risk Score and Plan: 3 and Ondansetron and Dexamethasone  Airway Management Planned: Oral ETT and LMA  Additional Equipment: None  Intra-op Plan:   Post-operative Plan: Extubation in OR  Informed Consent: I have reviewed the patients History and Physical, chart, labs and discussed the procedure including the risks, benefits and alternatives for the proposed anesthesia with the patient or authorized representative who has indicated his/her understanding and acceptance.       Plan Discussed with: Anesthesiologist and CRNA  Anesthesia Plan Comments: (  )         Anesthesia Quick Evaluation

## 2023-12-27 ENCOUNTER — Encounter (HOSPITAL_BASED_OUTPATIENT_CLINIC_OR_DEPARTMENT_OTHER)
Admission: RE | Disposition: A | Discharge status: Home / Self Care | Payer: Self-pay | Source: Home / Self Care | Attending: General Surgery

## 2023-12-27 ENCOUNTER — Ambulatory Visit (HOSPITAL_BASED_OUTPATIENT_CLINIC_OR_DEPARTMENT_OTHER): Payer: BC Managed Care – PPO | Admitting: Anesthesiology

## 2023-12-27 ENCOUNTER — Other Ambulatory Visit: Payer: Self-pay

## 2023-12-27 ENCOUNTER — Observation Stay (HOSPITAL_BASED_OUTPATIENT_CLINIC_OR_DEPARTMENT_OTHER)
Admission: RE | Admit: 2023-12-27 | Discharge: 2023-12-28 | Disposition: A | Discharge status: Home / Self Care | Payer: BC Managed Care – PPO | Attending: General Surgery | Admitting: General Surgery

## 2023-12-27 ENCOUNTER — Encounter (HOSPITAL_BASED_OUTPATIENT_CLINIC_OR_DEPARTMENT_OTHER): Payer: Self-pay | Admitting: General Surgery

## 2023-12-27 DIAGNOSIS — N6022 Fibroadenosis of left breast: Secondary | ICD-10-CM | POA: Insufficient documentation

## 2023-12-27 DIAGNOSIS — Z1501 Genetic susceptibility to malignant neoplasm of breast: Secondary | ICD-10-CM | POA: Diagnosis not present

## 2023-12-27 DIAGNOSIS — Z4001 Encounter for prophylactic removal of breast: Secondary | ICD-10-CM | POA: Diagnosis not present

## 2023-12-27 DIAGNOSIS — N6021 Fibroadenosis of right breast: Secondary | ICD-10-CM | POA: Insufficient documentation

## 2023-12-27 DIAGNOSIS — G8918 Other acute postprocedural pain: Secondary | ICD-10-CM | POA: Diagnosis not present

## 2023-12-27 DIAGNOSIS — C50911 Malignant neoplasm of unspecified site of right female breast: Secondary | ICD-10-CM | POA: Diagnosis not present

## 2023-12-27 DIAGNOSIS — N62 Hypertrophy of breast: Principal | ICD-10-CM | POA: Insufficient documentation

## 2023-12-27 DIAGNOSIS — Z01818 Encounter for other preprocedural examination: Principal | ICD-10-CM

## 2023-12-27 DIAGNOSIS — C50912 Malignant neoplasm of unspecified site of left female breast: Secondary | ICD-10-CM | POA: Diagnosis not present

## 2023-12-27 DIAGNOSIS — N6031 Fibrosclerosis of right breast: Secondary | ICD-10-CM | POA: Insufficient documentation

## 2023-12-27 DIAGNOSIS — N6032 Fibrosclerosis of left breast: Secondary | ICD-10-CM | POA: Insufficient documentation

## 2023-12-27 DIAGNOSIS — Z803 Family history of malignant neoplasm of breast: Secondary | ICD-10-CM | POA: Diagnosis not present

## 2023-12-27 HISTORY — PX: BREAST RECONSTRUCTION WITH PLACEMENT OF TISSUE EXPANDER AND ALLODERM: SHX6805

## 2023-12-27 HISTORY — PX: NIPPLE SPARING MASTECTOMY: SHX6537

## 2023-12-27 LAB — POCT PREGNANCY, URINE: Preg Test, Ur: NEGATIVE

## 2023-12-27 SURGERY — MASTECTOMY, NIPPLE SPARING
Anesthesia: General | Site: Breast | Laterality: Bilateral

## 2023-12-27 MED ORDER — FENTANYL CITRATE (PF) 100 MCG/2ML IJ SOLN
INTRAMUSCULAR | Status: AC
  Filled 2023-12-27: qty 2

## 2023-12-27 MED ORDER — CEFAZOLIN SODIUM-DEXTROSE 2-4 GM/100ML-% IV SOLN
INTRAVENOUS | Status: AC
  Filled 2023-12-27: qty 100

## 2023-12-27 MED ORDER — ACETAMINOPHEN 325 MG PO TABS
325.0000 mg | ORAL_TABLET | ORAL | Status: DC | PRN
Start: 2023-12-27 — End: 2023-12-28

## 2023-12-27 MED ORDER — SIMETHICONE 80 MG PO CHEW: 40.0000 mg | CHEWABLE_TABLET | Freq: Four times a day (QID) | ORAL | Status: DC | PRN

## 2023-12-27 MED ORDER — CELECOXIB 200 MG PO CAPS
200.0000 mg | ORAL_CAPSULE | Freq: Once | ORAL | Status: AC
  Administered 2023-12-27: 200 mg via ORAL

## 2023-12-27 MED ORDER — BUPIVACAINE HCL (PF) 0.25 % IJ SOLN
INTRAMUSCULAR | Status: DC | PRN
  Administered 2023-12-27 (×2): 30 mL via PERINEURAL

## 2023-12-27 MED ORDER — DEXAMETHASONE SODIUM PHOSPHATE 10 MG/ML IJ SOLN
INTRAMUSCULAR | Status: DC | PRN
  Administered 2023-12-27: 8 mg via INTRAVENOUS

## 2023-12-27 MED ORDER — FENTANYL CITRATE (PF) 100 MCG/2ML IJ SOLN
INTRAMUSCULAR | Status: DC | PRN
  Administered 2023-12-27 (×2): 50 ug via INTRAVENOUS

## 2023-12-27 MED ORDER — CELECOXIB 200 MG PO CAPS
ORAL_CAPSULE | ORAL | Status: AC
  Filled 2023-12-27: qty 1

## 2023-12-27 MED ORDER — LIDOCAINE 2% (20 MG/ML) 5 ML SYRINGE
INTRAMUSCULAR | Status: AC
  Filled 2023-12-27: qty 5

## 2023-12-27 MED ORDER — FLUOXETINE HCL 20 MG PO CAPS
20.0000 mg | ORAL_CAPSULE | Freq: Every day | ORAL | Status: DC
  Filled 2023-12-27: qty 1

## 2023-12-27 MED ORDER — SUMATRIPTAN SUCCINATE 50 MG PO TABS
50.0000 mg | ORAL_TABLET | ORAL | Status: DC | PRN
  Filled 2023-12-27 (×2): qty 1

## 2023-12-27 MED ORDER — SUGAMMADEX SODIUM 200 MG/2ML IV SOLN
INTRAVENOUS | Status: DC | PRN
  Administered 2023-12-27: 200 mg via INTRAVENOUS

## 2023-12-27 MED ORDER — PROPOFOL 10 MG/ML IV BOLUS
INTRAVENOUS | Status: DC | PRN
  Administered 2023-12-27: 150 mg via INTRAVENOUS

## 2023-12-27 MED ORDER — ONDANSETRON HCL 4 MG/2ML IJ SOLN
INTRAMUSCULAR | Status: AC
  Filled 2023-12-27: qty 2

## 2023-12-27 MED ORDER — ACETAMINOPHEN 500 MG PO TABS: 1000.0000 mg | ORAL_TABLET | ORAL | Status: AC

## 2023-12-27 MED ORDER — ONDANSETRON HCL 4 MG/2ML IJ SOLN: 4.0000 mg | Freq: Once | INTRAMUSCULAR | Status: DC | PRN

## 2023-12-27 MED ORDER — ONDANSETRON 4 MG PO TBDP: 4.0000 mg | ORAL_TABLET | Freq: Four times a day (QID) | ORAL | Status: DC | PRN

## 2023-12-27 MED ORDER — CEFAZOLIN SODIUM-DEXTROSE 2-4 GM/100ML-% IV SOLN
2.0000 g | INTRAVENOUS | Status: AC
  Administered 2023-12-27: 2 g via INTRAVENOUS

## 2023-12-27 MED ORDER — ACETAMINOPHEN 500 MG PO TABS
ORAL_TABLET | ORAL | Status: AC
  Filled 2023-12-27: qty 2

## 2023-12-27 MED ORDER — ALPRAZOLAM 0.25 MG PO TABS: 0.5000 mg | ORAL_TABLET | Freq: Every evening | ORAL | Status: DC | PRN

## 2023-12-27 MED ORDER — SODIUM CHLORIDE 0.9 % IV SOLN
INTRAVENOUS | Status: AC
  Filled 2023-12-27: qty 10

## 2023-12-27 MED ORDER — CEFAZOLIN SODIUM-DEXTROSE 1-4 GM/50ML-% IV SOLN
1.0000 g | Freq: Three times a day (TID) | INTRAVENOUS | Status: AC
  Administered 2023-12-27 (×2): 1 g via INTRAVENOUS
  Filled 2023-12-27 (×2): qty 50

## 2023-12-27 MED ORDER — PROPOFOL 500 MG/50ML IV EMUL
INTRAVENOUS | Status: DC | PRN
  Administered 2023-12-27: 150 ug/kg/min via INTRAVENOUS

## 2023-12-27 MED ORDER — SCOPOLAMINE 1 MG/3DAYS TD PT72
1.0000 | MEDICATED_PATCH | Freq: Once | TRANSDERMAL | Status: DC
  Administered 2023-12-27: 1.5 mg via TRANSDERMAL

## 2023-12-27 MED ORDER — DEXAMETHASONE SODIUM PHOSPHATE 10 MG/ML IJ SOLN
INTRAMUSCULAR | Status: AC
  Filled 2023-12-27: qty 1

## 2023-12-27 MED ORDER — LACTATED RINGERS IV SOLN: INTRAVENOUS | Status: DC

## 2023-12-27 MED ORDER — MIDAZOLAM HCL 2 MG/2ML IJ SOLN
INTRAMUSCULAR | Status: AC
Start: 2023-12-27 — End: ?
  Filled 2023-12-27: qty 2

## 2023-12-27 MED ORDER — MEPERIDINE HCL 25 MG/ML IJ SOLN: 6.2500 mg | INTRAMUSCULAR | Status: DC | PRN

## 2023-12-27 MED ORDER — METHOCARBAMOL 1000 MG/10ML IJ SOLN: 500.0000 mg | Freq: Three times a day (TID) | INTRAMUSCULAR | Status: DC | PRN

## 2023-12-27 MED ORDER — OXYCODONE HCL 5 MG PO TABS
5.0000 mg | ORAL_TABLET | Freq: Once | ORAL | Status: DC | PRN
  Filled 2023-12-27: qty 1

## 2023-12-27 MED ORDER — ACETAMINOPHEN 500 MG PO TABS
1000.0000 mg | ORAL_TABLET | Freq: Once | ORAL | Status: AC
  Administered 2023-12-27: 1000 mg via ORAL

## 2023-12-27 MED ORDER — ONDANSETRON HCL 4 MG/2ML IJ SOLN: 4.0000 mg | Freq: Four times a day (QID) | INTRAMUSCULAR | Status: DC | PRN

## 2023-12-27 MED ORDER — ONDANSETRON HCL 4 MG/2ML IJ SOLN
INTRAMUSCULAR | Status: DC | PRN
  Administered 2023-12-27: 4 mg via INTRAVENOUS

## 2023-12-27 MED ORDER — LIDOCAINE 2% (20 MG/ML) 5 ML SYRINGE
INTRAMUSCULAR | Status: DC | PRN
  Administered 2023-12-27: 20 mg via INTRAVENOUS

## 2023-12-27 MED ORDER — FENTANYL CITRATE (PF) 100 MCG/2ML IJ SOLN
100.0000 ug | Freq: Once | INTRAMUSCULAR | Status: AC
  Administered 2023-12-27: 100 ug via INTRAVENOUS

## 2023-12-27 MED ORDER — MIDAZOLAM HCL 2 MG/2ML IJ SOLN
2.0000 mg | Freq: Once | INTRAMUSCULAR | Status: AC
  Administered 2023-12-27: 2 mg via INTRAVENOUS

## 2023-12-27 MED ORDER — SODIUM CHLORIDE 0.9 % IV SOLN
INTRAVENOUS | Status: DC | PRN
  Administered 2023-12-27: 500 mL

## 2023-12-27 MED ORDER — FENTANYL CITRATE (PF) 100 MCG/2ML IJ SOLN
25.0000 ug | INTRAMUSCULAR | Status: DC | PRN
  Administered 2023-12-27 (×2): 50 ug via INTRAVENOUS

## 2023-12-27 MED ORDER — LACTATED RINGERS IV SOLN: INTRAVENOUS | Status: DC | PRN

## 2023-12-27 MED ORDER — HYDROMORPHONE HCL 1 MG/ML IJ SOLN
INTRAMUSCULAR | Status: AC
  Filled 2023-12-27: qty 0.5

## 2023-12-27 MED ORDER — ACETAMINOPHEN 160 MG/5ML PO SOLN: 325.0000 mg | ORAL | Status: DC | PRN

## 2023-12-27 MED ORDER — ROCURONIUM BROMIDE 100 MG/10ML IV SOLN
INTRAVENOUS | Status: DC | PRN
  Administered 2023-12-27: 50 mg via INTRAVENOUS

## 2023-12-27 MED ORDER — TRANEXAMIC ACID 1000 MG/10ML IV SOLN
INTRAVENOUS | Status: AC
  Filled 2023-12-27: qty 30

## 2023-12-27 MED ORDER — OXYCODONE HCL 5 MG/5ML PO SOLN: 5.0000 mg | Freq: Once | ORAL | Status: DC | PRN

## 2023-12-27 MED ORDER — MIDAZOLAM HCL 5 MG/5ML IJ SOLN
INTRAMUSCULAR | Status: DC | PRN
  Administered 2023-12-27 (×2): 1 mg via INTRAVENOUS

## 2023-12-27 MED ORDER — TRANEXAMIC ACID 1000 MG/10ML IV SOLN
Status: DC | PRN
  Administered 2023-12-27: 3000 mg via TOPICAL

## 2023-12-27 MED ORDER — PROPOFOL 10 MG/ML IV BOLUS
INTRAVENOUS | Status: AC
  Filled 2023-12-27: qty 20

## 2023-12-27 MED ORDER — ACETAMINOPHEN 500 MG PO TABS
1000.0000 mg | ORAL_TABLET | Freq: Four times a day (QID) | ORAL | Status: DC
  Administered 2023-12-27 – 2023-12-28 (×4): 1000 mg via ORAL
  Filled 2023-12-27 (×4): qty 2

## 2023-12-27 MED ORDER — HYDROMORPHONE HCL 1 MG/ML IJ SOLN
INTRAMUSCULAR | Status: DC | PRN
  Administered 2023-12-27: .5 mg via INTRAVENOUS

## 2023-12-27 MED ORDER — SCOPOLAMINE 1 MG/3DAYS TD PT72
MEDICATED_PATCH | TRANSDERMAL | Status: AC
  Filled 2023-12-27: qty 1

## 2023-12-27 MED ORDER — METHOCARBAMOL 500 MG PO TABS
500.0000 mg | ORAL_TABLET | Freq: Three times a day (TID) | ORAL | Status: DC | PRN
  Administered 2023-12-27 – 2023-12-28 (×2): 500 mg via ORAL
  Filled 2023-12-27 (×2): qty 1

## 2023-12-27 MED ORDER — OXYCODONE HCL 5 MG PO TABS
5.0000 mg | ORAL_TABLET | ORAL | Status: DC | PRN
  Administered 2023-12-27 (×3): 5 mg via ORAL
  Filled 2023-12-27 (×3): qty 1

## 2023-12-27 MED ORDER — ROCURONIUM BROMIDE 10 MG/ML (PF) SYRINGE
PREFILLED_SYRINGE | INTRAVENOUS | Status: AC
  Filled 2023-12-27: qty 10

## 2023-12-27 SURGICAL SUPPLY — 65 items
ALLOGRAFT PERF 16X20 1.6+/-0.4 (Tissue) IMPLANT
APPLIER CLIP 11 MED OPEN (CLIP) ×2
APPLIER CLIP 9.375 MED OPEN (MISCELLANEOUS)
BAG DECANTER FOR FLEXI CONT (MISCELLANEOUS) ×4 IMPLANT
BINDER BREAST LRG (GAUZE/BANDAGES/DRESSINGS) IMPLANT
BINDER BREAST XLRG (GAUZE/BANDAGES/DRESSINGS) IMPLANT
BLADE SURG 10 STRL SS (BLADE) ×4 IMPLANT
BLADE SURG 15 STRL LF DISP TIS (BLADE) ×4 IMPLANT
BNDG GAUZE DERMACEA FLUFF 4 (GAUZE/BANDAGES/DRESSINGS) IMPLANT
CANISTER SUCT 1200ML W/VALVE (MISCELLANEOUS) ×4 IMPLANT
CHLORAPREP W/TINT 26 (MISCELLANEOUS) ×4 IMPLANT
CLIP APPLIE 11 MED OPEN (CLIP) ×2 IMPLANT
CLIP APPLIE 9.375 MED OPEN (MISCELLANEOUS) IMPLANT
COVER BACK TABLE 60X90IN (DRAPES) ×4 IMPLANT
COVER MAYO STAND STRL (DRAPES) ×4 IMPLANT
DERMABOND ADVANCED .7 DNX12 (GAUZE/BANDAGES/DRESSINGS) ×6 IMPLANT
DRAIN CHANNEL 15F RND FF W/TCR (WOUND CARE) IMPLANT
DRAIN CHANNEL 19F RND (DRAIN) IMPLANT
DRAPE INCISE IOBAN 66X45 STRL (DRAPES) IMPLANT
DRAPE TOP ARMCOVERS (MISCELLANEOUS) ×4 IMPLANT
DRAPE U-SHAPE 76X120 STRL (DRAPES) ×2 IMPLANT
DRAPE UTILITY XL STRL (DRAPES) ×4 IMPLANT
DRSG TEGADERM 4X10 (GAUZE/BANDAGES/DRESSINGS) IMPLANT
DRSG TEGADERM 4X4.75 (GAUZE/BANDAGES/DRESSINGS) IMPLANT
ELECT BLADE 4.0 EZ CLEAN MEGAD (MISCELLANEOUS) ×1
ELECT COATED BLADE 2.86 ST (ELECTRODE) ×2 IMPLANT
ELECT REM PT RETURN 9FT ADLT (ELECTROSURGICAL) ×1
ELECTRODE BLDE 4.0 EZ CLN MEGD (MISCELLANEOUS) ×2 IMPLANT
ELECTRODE REM PT RTRN 9FT ADLT (ELECTROSURGICAL) ×4 IMPLANT
EVACUATOR SILICONE 100CC (DRAIN) ×2 IMPLANT
EXPANDER TISSUE FV FOURTE 400 (Prosthesis & Implant Plastic) IMPLANT
GAUZE PAD ABD 8X10 STRL (GAUZE/BANDAGES/DRESSINGS) ×6 IMPLANT
GAUZE SPONGE 4X4 12PLY STRL (GAUZE/BANDAGES/DRESSINGS) ×2 IMPLANT
GLOVE BIO SURGEON STRL SZ 6 (GLOVE) ×2 IMPLANT
GLOVE BIO SURGEON STRL SZ 6.5 (GLOVE) IMPLANT
GLOVE BIO SURGEON STRL SZ7 (GLOVE) ×2 IMPLANT
GLOVE BIOGEL PI IND STRL 6.5 (GLOVE) IMPLANT
GLOVE BIOGEL PI IND STRL 7.5 (GLOVE) ×2 IMPLANT
GOWN STRL REUS W/ TWL LRG LVL3 (GOWN DISPOSABLE) ×10 IMPLANT
NS IRRIG 1000ML POUR BTL (IV SOLUTION) ×2 IMPLANT
PACK BASIN DAY SURGERY FS (CUSTOM PROCEDURE TRAY) ×2 IMPLANT
PENCIL SMOKE EVACUATOR (MISCELLANEOUS) ×4 IMPLANT
PIN SAFETY STERILE (MISCELLANEOUS) IMPLANT
SHEET MEDIUM DRAPE 40X70 STRL (DRAPES) ×2 IMPLANT
SLEEVE SCD COMPRESS KNEE MED (STOCKING) ×4 IMPLANT
SPONGE T-LAP 18X18 ~~LOC~~+RFID (SPONGE) ×8 IMPLANT
STAPLER SKIN PROX WIDE 3.9 (STAPLE) ×2 IMPLANT
SUT CHROMIC 4 0 RB 1X27 (SUTURE) IMPLANT
SUT ETHILON 2 0 FS 18 (SUTURE) ×2 IMPLANT
SUT MNCRL AB 4-0 PS2 18 (SUTURE) ×4 IMPLANT
SUT SILK 2 0 SH (SUTURE) IMPLANT
SUT STRATAFIX 0 PDS 27 VIOLET (SUTURE) ×2
SUT VIC AB 3-0 SH 27X BRD (SUTURE) IMPLANT
SUT VIC AB 4-0 PS2 18 (SUTURE) IMPLANT
SUT VICRYL 0 CT-2 (SUTURE) IMPLANT
SUTURE STRATFX 0 PDS 27 VIOLET (SUTURE) IMPLANT
SYR 50ML LL SCALE MARK (SYRINGE) ×2 IMPLANT
SYR BULB IRRIG 60ML STRL (SYRINGE) ×6 IMPLANT
TISSUE EXPNDR FV FOURTE 400 (Prosthesis & Implant Plastic) ×2 IMPLANT
TOWEL GREEN STERILE FF (TOWEL DISPOSABLE) ×6 IMPLANT
TRAY DSU PREP LF (CUSTOM PROCEDURE TRAY) IMPLANT
TRAY FOLEY W/BAG SLVR 14FR LF (SET/KITS/TRAYS/PACK) IMPLANT
TUBE CONNECTING 20X1/4 (TUBING) ×2 IMPLANT
UNDERPAD 30X36 HEAVY ABSORB (UNDERPADS AND DIAPERS) ×6 IMPLANT
YANKAUER SUCT BULB TIP NO VENT (SUCTIONS) ×4 IMPLANT

## 2023-12-27 NOTE — Anesthesia Postprocedure Evaluation (Signed)
Anesthesia Post Note  Patient: Leah Davis  Procedure(s) Performed: BILATERAL RISK REDUCING NIPPLE SPARING MASTECTOMIES (Bilateral: Breast) BREAST RECONSTRUCTION WITH PLACEMENT OF TISSUE EXPANDER AND ALLODERM (Bilateral: Breast)     Patient location during evaluation: PACU Anesthesia Type: General Level of consciousness: awake and alert Pain management: pain level controlled Vital Signs Assessment: post-procedure vital signs reviewed and stable Respiratory status: spontaneous breathing, nonlabored ventilation, respiratory function stable and patient connected to nasal cannula oxygen Cardiovascular status: blood pressure returned to baseline and stable Postop Assessment: no apparent nausea or vomiting Anesthetic complications: no   No notable events documented.  Last Vitals:  Vitals:   12/27/23 1330 12/27/23 1715  BP:  130/74  Pulse:  94  Resp:  18  Temp:  37.3 C  SpO2: 99% 100%    Last Pain:  Vitals:   12/27/23 1741  TempSrc:   PainSc: 6                  Crist Kruszka

## 2023-12-27 NOTE — Anesthesia Procedure Notes (Signed)
Anesthesia Regional Block: Pectoralis block   Pre-Anesthetic Checklist: , timeout performed,  Correct Patient, Correct Site, Correct Laterality,  Correct Procedure, Correct Position, site marked,  Risks and benefits discussed,  Surgical consent,  Pre-op evaluation,  At surgeon's request and post-op pain management  Laterality: Left and Right  Prep: chloraprep       Needles:  Injection technique: Single-shot  Needle Type: Echogenic Stimulator Needle     Needle Length: 5cm  Needle Gauge: 22     Additional Needles:   Procedures:,,,, ultrasound used (permanent image in chart),,    Narrative:  Start time: 12/27/2023 7:10 AM End time: 12/27/2023 7:20 AM Injection made incrementally with aspirations every 5 mL.  Performed by: Personally  Anesthesiologist: Bethena Midget, MD  Additional Notes: Functioning IV was confirmed and monitors were applied.  A 50mm 22ga Arrow echogenic stimulator needle was used. Sterile prep and drape,hand hygiene and sterile gloves were used. Ultrasound guidance: relevant anatomy identified, needle position confirmed, local anesthetic spread visualized around nerve(s)., vascular puncture avoided.  Image printed for medical record. Negative aspiration and negative test dose prior to incremental administration of local anesthetic. The patient tolerated the procedure well.

## 2023-12-27 NOTE — Op Note (Signed)
Preoperative diagnosis: BRCA1 positive Postoperative diagnosis: Same as above Procedure: Bilateral risk-reducing nipple sparing mastectomies Surgeon: Dr. Harden Mo Anesthesia: General With bilateral pectoral blocks Estimated blood loss: 150 cc Specimens: 1.  Right nipple sparing mastectomy marked short superior, long lateral, double nipple areolar complex 2.  Right nipple biopsy 3.  Left nipple sparing mastectomy marked short superior, long lateral, double nipple areolar complex 4.  Left nipple biopsy Drains: Per plastic surgery Sponge and count was correct completion Disposition Case turned over to plastic surgery for reconstruction  Indications: This is a 38 year old female with a BRCA1 mutation.  She has really normal imaging and a benign biopsy prior to beginning.  She desires risk-reducing mastectomies.  Procedure: After informed consent was obtained she first underwent bilateral pectoral blocks.  She was given antibiotics.  SCDs were in place.  She was placed under general anesthesia without complication.  A Foley catheter was placed.  She was prepped and draped in the standard sterile surgical fashion.  A surgical timeout was then performed.  I did the right side first.  I made a 10 cm incision 8 cm from the xiphoid process in the inframammary fold.  I then created a posterior flap to include the posterior fascia to the parasternal region, clavicle, latissimus laterally.  I then created the anterior flap which was somewhat difficult because she did not really have much of a fat plane.  This was fairly thin and parts that she had breast tissue that approaches her skin.  I did a fair amount of this with the facelift scissors accounting for much of the blood loss.  I was able to remove the breast tissue marked as above.  I then remove the nipple separately.  I then obtained hemostasis.  I placed a TXA soaked sponge and examined that later and it appeared to be hemostatic.  I then today  the left side.  The left side was done in a similar fashion.  The entire breast was then removed and marked as above.  I placed another TXA soaked sponge here and examined that it also appeared to be hemostatic.  I then turned the case over to plastic surgery for reconstruction.

## 2023-12-27 NOTE — Transfer of Care (Signed)
Immediate Anesthesia Transfer of Care Note  Patient: Leah Davis  Procedure(s) Performed: BILATERAL RISK REDUCING NIPPLE SPARING MASTECTOMIES (Bilateral: Breast) BREAST RECONSTRUCTION WITH PLACEMENT OF TISSUE EXPANDER AND ALLODERM (Bilateral: Breast)  Patient Location: PACU  Anesthesia Type:General  Level of Consciousness: awake, alert , oriented, and patient cooperative  Airway & Oxygen Therapy: Patient Spontanous Breathing and Patient connected to nasal cannula oxygen  Post-op Assessment: Report given to RN and Post -op Vital signs reviewed and stable  Post vital signs: Reviewed and stable  Last Vitals:  Vitals Value Taken Time  BP 108/67 12/27/23 1103  Temp 36.2 C 12/27/23 1103  Pulse 69 12/27/23 1111  Resp 20 12/27/23 1111  SpO2 100 % 12/27/23 1111  Vitals shown include unfiled device data.  Last Pain:  Vitals:   12/27/23 0629  TempSrc: Temporal  PainSc: 0-No pain      Patients Stated Pain Goal: 7 (12/27/23 4098)  Complications: No notable events documented.

## 2023-12-27 NOTE — Op Note (Signed)
Operative Note   DATE OF OPERATION: 1.27.2025  LOCATION: [facility]-observation  SURGICAL DIVISION: Plastic Surgery  PREOPERATIVE DIAGNOSES:  1. BRCA1  POSTOPERATIVE DIAGNOSES:  same  PROCEDURE:  1. Bilateral breast reconstruction with tissue expanders 2. Acellular dermis (Alloderm) to bilateral chest 600 cm2  SURGEON: Glenna Fellows MD [facility]  ASSISTANT: none  ANESTHESIA:  General.   EBL: 600 ml for entire procedure  COMPLICATIONS: None immediate.   INDICATIONS FOR PROCEDURE:  The patient, Leah Davis, is a 38 y.o. female born on 24-Jun-1986, is here for immediate prepectoral tissue expander acellular dermis reconstruction following nipple sparing mastectomies.   FINDINGS: Natrelle 133SFV-12-T 400 ml tissue expanders placed bilateral. Initial fill volume 180 ml air bilateral RIGHT SN [id number] LET SN [id number]  DESCRIPTION OF PROCEDURE:  The patient was marked with the patient in the preoperative area to mark sternal notch, chest midline, anterior axillary lines and inframammary folds. Following induction, Foley catheter placed. The patient's operative site was prepped and draped in a sterile fashion. A time out was performed and all information was confirmed to be correct. Following completion of mastectomies, reconstruction began on the right side. Hemostasis ensured. Cavity irrigated with saline solution containing Ancef, gentamicin, and Betadine. A 19 Fr drain was placed in subcutaneous position laterally and a 15 Fr drain placed along inframammary fold. Each secured to skin with 2-0 nylon. The tissue expanders were prepared on back table prior in insertion. The expander was filled with air. Perforated acellular dermis was draped over anterior surface expander. The ADM was then secured to itself over posterior surface of expander with 4-0 chromic. Redundant folds acellular dermis excised so that the ADM lay flat without folds over air filled expander. The expander was secured  to fascia over lateral sternal border with a 0 vicryl. The lateral tab was also secured to pectoralis muscle with 0-vicryl. The ADM was secured to pectoralis muscle and chest wall along inferior border at inframammary fold with 0 Strattafix suture. Laterally the mastectomy flap over posterior axillary line was advanced anteriorly and the subcutaneous tissue and superficial fascia was secured to pectoralis and serratus muscles with 0-vicryl. Skin closure completed with 3-0 vicryl in fascial layer and 4-0 vicryl in dermis. Skin closure completed with 4-0 monocryl subcuticular.   I then directed my attention to left chest where similar irrigation and drain placement completed. During course of mastectomy, the skin flap was perforated just superior to areola. This was primarily repaired with interrupted 4-0 monocryl in dermis and simple running 4-0 monocryl skin closure. The prepared expander with ADM secured over anterior surface was placed in chest and tabs secured to chest wall and pectoralis muscle with 0- vicryl suture. The acellular dermis at inframammary fold was secured to chest wall with 0 Strattafix suture. Laterally the mastectomy flap over posterior axillary line was advanced anteriorly and the subcutaneous tissue and superficial fascia was secured to pectoralis and serratus muscles with 0-vicryl. Skin closure completed with 3-0 vicryl in fascial layer and 4-0 vicryl in dermis. Skin closure completed with 4-0 monocryl subcuticular. Patient brought to upright sitting position, and the mastectomy flaps were redraped so that NAC was symmetric from sternal notch and chest midline. Dermabond applied over incisions. Tegaderm dressings applied followed by dry dressing, breast binder.   The patient was allowed to wake from anesthesia, extubated and taken to the recovery room in satisfactory condition.   SPECIMENS: none  DRAINS: 15 and 19 Fr JP in right and left subcutaneous chest  Glenna Fellows, MD  [facility] Plastic & Reconstructive Surgery  Office/ physician access line after hours [phone]

## 2023-12-27 NOTE — Anesthesia Procedure Notes (Signed)
Procedure Name: Intubation Date/Time: 12/27/2023 7:35 AM  Performed by: Demetrio Lapping, CRNAPre-anesthesia Checklist: Patient identified, Emergency Drugs available, Suction available and Patient being monitored Patient Re-evaluated:Patient Re-evaluated prior to induction Oxygen Delivery Method: Circle System Utilized Preoxygenation: Pre-oxygenation with 100% oxygen Induction Type: IV induction Ventilation: Mask ventilation without difficulty Laryngoscope Size: Mac and 3 Grade View: Grade I Tube type: Oral Number of attempts: 1 Airway Equipment and Method: Stylet Placement Confirmation: ETT inserted through vocal cords under direct vision, positive ETCO2 and breath sounds checked- equal and bilateral Secured at: 21 cm Tube secured with: Tape Dental Injury: Teeth and Oropharynx as per pre-operative assessment

## 2023-12-27 NOTE — Interval H&P Note (Signed)
History and Physical Interval Note:  12/27/2023 6:47 AM  Leah Davis  has presented today for surgery, with the diagnosis of BREAST CANCER 1 MUTATION.  The various methods of treatment have been discussed with the patient and family. After consideration of risks, benefits and other options for treatment, the patient has consented to  Procedure(s): BILATERAL RISK REDUCING NIPPLE SPARING MASTECTOMIES (Bilateral) BREAST RECONSTRUCTION WITH PLACEMENT OF TISSUE EXPANDER AND ALLODERM (Bilateral) as a surgical intervention.  The patient's history has been reviewed, patient examined, no change in status, stable for surgery.  I have reviewed the patient's chart and labs.  Questions were answered to the patient's satisfaction.     [name]

## 2023-12-27 NOTE — H&P (Signed)
  38 year old female who presents with a BRCA1 mutation that was diagnosed a number of years ago. She has been being followed by oncology. She has had multiple MRIs and multiple biopsies. Her latest MRI was done in February 2024. She has C density breast tissue. The right breast is normal. She does have a biopsy clip in there. The left breast has in the lower outer left breast a 1.5 x 1.2 cm area of non-mass enhancement. She has no abnormal appearing lymph nodes. 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 She had a mammogram in April 2023 that is negative. She has 2 children. She has had IVF. She does not think she desires any more children. She does have some anemia from heavy periods that she is on norethindrone for now. She has follow up MRI 8/2 that is negative. She is here to discuss surgery  Review of Systems: A complete review of systems was obtained from the patient. I have reviewed this information and discussed as appropriate with the patient. See HPI as well for other ROS.  Review of Systems  All other systems reviewed and are negative.   Medical History: History reviewed. No pertinent past medical history.  Past Surgical History:  Procedure Laterality Date  ivf surgery  2014   No Known Allergies  Current Outpatient Medications on File Prior to Visit  Medication Sig Dispense Refill  ALPRAZolam (XANAX) 0.25 MG tablet Take 0.25 mg by mouth as needed  FLUoxetine (PROZAC) 20 MG capsule Take 20 mg by mouth once daily  norethindrone (AYGESTIN) 5 mg tablet as directed  rizatriptan (MAXALT) 10 MG tablet AS DIRECTED ORALLY ONE TAB AT START OF MIGRAINE AND MAY REPEAT 2 HOURS LATER IF NEEDED    Family History  Problem Relation Age of Onset  Breast cancer Mother    Social History   Tobacco Use  Smoking Status Never  Smokeless Tobacco Never  Marital status: Married  Tobacco Use  Smoking status: Never  Smokeless tobacco: Never  Substance and Sexual  Activity  Alcohol use: Yes  Drug use: Not Currently    Objective:   Body mass index is 27.76 kg/m.  Physical Exam Vitals reviewed.  Constitutional:  Appearance: Normal appearance.  Chest:  Breasts: Right: No inverted nipple, mass or nipple discharge.  Left: No inverted nipple, mass or nipple discharge.  Lymphadenopathy:  Upper Body:  Right upper body: No supraclavicular or axillary adenopathy.  Left upper body: No supraclavicular or axillary adenopathy.  Neurological:  Mental Status: She is alert.    Assessment and Plan:   BRCA 1 mutation  Bilateral risk reducing nsm

## 2023-12-27 NOTE — Progress Notes (Signed)
Assisted Dr. Tacy Dura with left, right, pectoralis, ultrasound guided block. Side rails up, monitors on throughout procedure. See vital signs in flow sheet. Tolerated Procedure well.

## 2023-12-27 NOTE — Interval H&P Note (Signed)
History and Physical Interval Note:  12/27/2023 7:04 AM  Leah Davis  has presented today for surgery, with the diagnosis of BREAST CANCER 1 MUTATION.  The various methods of treatment have been discussed with the patient and family. After consideration of risks, benefits and other options for treatment, the patient has consented to  Procedure(s): BILATERAL RISK REDUCING NIPPLE SPARING MASTECTOMIES (Bilateral) BREAST RECONSTRUCTION WITH PLACEMENT OF TISSUE EXPANDER AND ALLODERM (Bilateral) as a surgical intervention.  The patient's history has been reviewed, patient examined, no change in status, stable for surgery.  I have reviewed the patient's chart and labs.  Questions were answered to the patient's satisfaction.     [name]

## 2023-12-28 DIAGNOSIS — Z1501 Genetic susceptibility to malignant neoplasm of breast: Secondary | ICD-10-CM | POA: Diagnosis not present

## 2023-12-28 DIAGNOSIS — N6031 Fibrosclerosis of right breast: Secondary | ICD-10-CM | POA: Diagnosis not present

## 2023-12-28 DIAGNOSIS — N6022 Fibroadenosis of left breast: Secondary | ICD-10-CM | POA: Diagnosis not present

## 2023-12-28 DIAGNOSIS — N6032 Fibrosclerosis of left breast: Secondary | ICD-10-CM | POA: Diagnosis not present

## 2023-12-28 DIAGNOSIS — N6021 Fibroadenosis of right breast: Secondary | ICD-10-CM | POA: Diagnosis not present

## 2023-12-28 DIAGNOSIS — N62 Hypertrophy of breast: Secondary | ICD-10-CM | POA: Diagnosis not present

## 2023-12-28 LAB — SURGICAL PATHOLOGY

## 2023-12-28 NOTE — Discharge Instructions (Signed)
About my Jackson-Pratt Bulb Drain  What is a Jackson-Pratt bulb? A Jackson-Pratt is a soft, round device used to collect drainage. It is connected to a long, thin drainage catheter, which is held in place by one or two small stiches near your surgical incision site. When the bulb is squeezed, it forms a vacuum, forcing the drainage to empty into the bulb.  Emptying the Jackson-Pratt bulb- To empty the bulb: 1. Release the plug on the top of the bulb. 2. Pour the bulb's contents into a measuring container which your nurse will provide. 3. Record the time emptied and amount of drainage. Empty the drain(s) as often as your     doctor or nurse recommends.  Date                  Time                    Amount (Drain 1)                 Amount (Drain 2)  _____________________________________________________________________  _____________________________________________________________________  _____________________________________________________________________  _____________________________________________________________________  _____________________________________________________________________  _____________________________________________________________________  _____________________________________________________________________  _____________________________________________________________________  Squeezing the Jackson-Pratt Bulb- To squeeze the bulb: 1. Make sure the plug at the top of the bulb is open. 2. Squeeze the bulb tightly in your fist. You will hear air squeezing from the bulb. 3. Replace the plug while the bulb is squeezed. 4. Use a safety pin to attach the bulb to your clothing. This will keep the catheter from     pulling at the bulb insertion site.  When to call your doctor- Call your doctor if:  Drain site becomes red, swollen or hot.  You have a fever greater than 101 degrees F.  There is oozing at the drain site.  Drain falls out (apply a guaze  bandage over the drain hole and secure it with tape).  Drainage increases daily not related to activity patterns. (You will usually have more drainage when you are active than when you are resting.)  Drainage has a bad odor.

## 2023-12-28 NOTE — Progress Notes (Signed)
Patient ID: Leah Davis, female   DOB: 01-24-1986, 38 y.o.   MRN: [id number] Doing well after surgery, plan dc home, will call with pathology, discussed surgery

## 2023-12-28 NOTE — Discharge Summary (Signed)
Physician Discharge Summary  Patient ID: Leah Davis MRN: [id number] DOB/AGE: 05-28-1986 38 y.o.  Admit date: 12/27/2023 Discharge date: 12/28/2023  Admission Diagnoses: BRCA1  Discharge Diagnoses:  Principal Problem:   BRCA gene positive   Discharged Condition: stable  Hospital Course: Post operatively patient did well tolerating diet, ambulatory with minimal assist, and pain controlled. Instructed on drain care and bathing.  Treatments: surgery: bilateral nipple sparing mastectomies tissue expander acellular dermis reconstruction 1.27.25  Discharge Exam: Blood pressure (!) 116/52, pulse 79, temperature 97.8 F (36.6 C), resp. rate 16, height 5\' 6"  (1.676 m), weight 83.2 kg, last menstrual period 12/23/2023, SpO2 100%. Incision/Wound: incisions intact dry Tegaderms in place drains serosanguinous  Disposition: Discharge disposition: 01-Home or Self Care       Discharge Instructions     Call MD for:  redness, tenderness, or signs of infection (pain, swelling, bleeding, redness, odor or green/yellow discharge around incision site)   Complete by: As directed    Call MD for:  temperature >100.5   Complete by: As directed    Discharge instructions   Complete by: As directed    Ok to remove dressings and shower am 1.29.25. Soap and water ok, pat Tegaderms dry. Do not remove Tegaderms. No creams or ointments over incisions. Do not let drains dangle in shower, attach to lanyard or similar.Strip and record drains twice daily and bring log to clinic visit.  Breast binder or soft compression bra all other times.  Ok to raise arms above shoulders for bathing and dressing.  No house yard work or exercise until cleared by MD.   Patient received all Rx preop. Recommend ibuprofen with meals to aid with pain control. Also ok to use Tylenol for pain. Recommend Miralax or Dulcolax as needed for constipation.   Driving Restrictions   Complete by: As directed    No driving for 2  weeks then no driving if taking prescription pain medication   Lifting restrictions   Complete by: As directed    No lifting > 5-10 lbs until cleared by MD   Resume previous diet   Complete by: As directed       Allergies as of 12/28/2023   No Known Allergies      Medication List     TAKE these medications    ALPRAZolam 0.5 MG tablet Commonly known as: XANAX Take 1 tablet (0.5 mg total) by mouth at bedtime as needed for anxiety.   FLUoxetine 20 MG capsule Commonly known as: PROZAC Take 1 capsule (20 mg total) by mouth daily.   Maxalt 5 MG tablet Generic drug: rizatriptan Take 1 tablet (5 mg total) by mouth as needed for migraine. May repeat in 2 hours if needed   multivitamin capsule Take 1 capsule by mouth daily.        Follow-up Information     [name], MD Follow up in 3 week(s).   Specialty: General Surgery Contact information: [address] [place] [address] [phone]         Glenna Fellows, MD Follow up in 1 week(s).   Specialty: Plastic Surgery Why: as scheduled Contact information: [address] [place] [address] [phone]                 Signed: Glenna Fellows 12/28/2023, 7:14 AM

## 2024-01-03 DIAGNOSIS — Z1501 Genetic susceptibility to malignant neoplasm of breast: Secondary | ICD-10-CM | POA: Diagnosis not present

## 2024-01-03 DIAGNOSIS — Z9013 Acquired absence of bilateral breasts and nipples: Secondary | ICD-10-CM | POA: Diagnosis not present

## 2024-01-03 DIAGNOSIS — Z1509 Genetic susceptibility to other malignant neoplasm: Secondary | ICD-10-CM | POA: Diagnosis not present

## 2024-01-04 ENCOUNTER — Encounter (HOSPITAL_BASED_OUTPATIENT_CLINIC_OR_DEPARTMENT_OTHER): Payer: Self-pay | Admitting: General Surgery

## 2024-01-10 DIAGNOSIS — Z1501 Genetic susceptibility to malignant neoplasm of breast: Secondary | ICD-10-CM | POA: Diagnosis not present

## 2024-01-10 DIAGNOSIS — Z1509 Genetic susceptibility to other malignant neoplasm: Secondary | ICD-10-CM | POA: Diagnosis not present

## 2024-01-10 DIAGNOSIS — Z9013 Acquired absence of bilateral breasts and nipples: Secondary | ICD-10-CM | POA: Diagnosis not present

## 2024-01-18 DIAGNOSIS — Z1509 Genetic susceptibility to other malignant neoplasm: Secondary | ICD-10-CM | POA: Diagnosis not present

## 2024-01-18 DIAGNOSIS — Z9013 Acquired absence of bilateral breasts and nipples: Secondary | ICD-10-CM | POA: Diagnosis not present

## 2024-01-18 DIAGNOSIS — Z1501 Genetic susceptibility to malignant neoplasm of breast: Secondary | ICD-10-CM | POA: Diagnosis not present

## 2024-01-20 NOTE — Therapy (Signed)
OUTPATIENT PHYSICAL THERAPY  UPPER EXTREMITY ONCOLOGY EVALUATION  Patient Name: Leah Davis MRN: [id number] DOB:1986-02-20, 38 y.o., female Today's Date: 01/21/2024  END OF SESSION:  PT End of Session - 01/21/24 0824     Visit Number 1    Number of Visits 1    PT Start Time 0800    PT Stop Time 0821    PT Time Calculation (min) 21 min    Activity Tolerance Patient tolerated treatment well    Behavior During Therapy [facility] for tasks assessed/performed             Past Medical History:  Diagnosis Date   Anxiety    BRCA1 positive 2014   Condyloma acuminata 05/2012   see 06/17/2012 note.   Migraine    WITHOUT AURA   Postpartum care following vaginal delivery (7/25) 06/23/2014   Pregnancy 02/24/2014   Past Surgical History:  Procedure Laterality Date   BREAST BIOPSY Right 12/30/2017   BREAST BIOPSY Left 09/16/2020   BREAST RECONSTRUCTION WITH PLACEMENT OF TISSUE EXPANDER AND ALLODERM Bilateral 12/27/2023   Procedure: BREAST RECONSTRUCTION WITH PLACEMENT OF TISSUE EXPANDER AND ALLODERM;  Surgeon: Glenna Fellows, MD;  Location: [facility];  Service: Plastics;  Laterality: Bilateral;   ivf     NIPPLE SPARING MASTECTOMY Bilateral 12/27/2023   Procedure: BILATERAL RISK REDUCING NIPPLE SPARING MASTECTOMIES;  Surgeon: [name], MD;  Location: [facility];  Service: General;  Laterality: Bilateral;   WISDOM TOOTH EXTRACTION     Patient Active Problem List   Diagnosis Date Noted   BRCA gene positive 12/27/2023   Breast cancer screening, high risk patient 11/13/2019   At high risk for breast cancer 11/13/2019   SVD (spontaneous vaginal delivery) 03/09/2017   Preeclampsia 03/08/2017   Postpartum care following vaginal delivery (4/9)  1st degree lac 06/23/2014   Normal labor 06/22/2014   Pregnancy 02/24/2014   BRCA1 positive 08/10/2012   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   Migraine    PMS (premenstrual syndrome)     PCP: Antony Haste, MD  REFERRING PROVIDER: [name], MD  REFERRING DIAG: BRCA2 positive   THERAPY DIAG:  BRCA1 positive  H/O bilateral mastectomy  Stiffness of left shoulder, not elsewhere classified  Stiffness of right shoulder, not elsewhere classified  ONSET DATE: 12/27/23  Rationale for Evaluation and Treatment: Rehabilitation  SUBJECTIVE:                                                                                                                                                                                           SUBJECTIVE STATEMENT: Honestly I am doing really great.  No  pain or no swelling.  I had my last fill.    PERTINENT HISTORY: bil NSM with expanders due to BRCA1 mutation on 12/27/23. Cleared for activity as tolerated.   PAIN:  Are you having pain? No  PRECAUTIONS: expanders   RED FLAGS: None   WEIGHT BEARING RESTRICTIONS: No  FALLS:  Has patient fallen in last 6 months? 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    LEISURE: doing okay   HAND DOMINANCE: right   PRIOR LEVEL OF FUNCTION: Independent  PATIENT GOALS: just check in   OBJECTIVE: Note: Objective measures were completed at Evaluation unless otherwise noted.  COGNITION: Overall cognitive status: Within functional limits for tasks assessed   OBSERVATIONS / OTHER ASSESSMENTS: healed incisions with glue still present   POSTURE: WNL  UPPER EXTREMITY AROM/PROM:  A/PROM RIGHT   eval   Shoulder extension 70  Shoulder flexion 155  Shoulder abduction 160  Shoulder internal rotation   Shoulder external rotation 80    (Blank rows = not tested)  A/PROM LEFT   eval  Shoulder extension 75  Shoulder flexion 155  Shoulder abduction 160  Shoulder internal rotation   Shoulder external rotation 85    (Blank rows = not tested)   QUICK DASH SURVEY: 2.23%                                                                                                                             TREATMENT DATE:  01/21/24 Showed pt wall walking into flexion and abduction and doorway stretch with handout Discussion/ education on incision care    PATIENT EDUCATION:  Education details: per today's note Person educated: Patient Education method: Chief Technology Officer Education comprehension: verbalized understanding  HOME EXERCISE PROGRAM: Access Code: [email or web address] URL: [email or web address] Date: 01/21/2024 Prepared by: Gwenevere Abbot  Exercises - Standing Shoulder Flexion Wall Walk  - 1 x daily - 7 x weekly - 5 reps - 10 sec hold - Standing Shoulder Abduction Finger Walk at Wall  - 1 x daily - 7 x weekly - 5 reps - 10 sec hold - Doorway Pec Stretch at 90 Degrees Abduction  - 1 x daily - 7 x weekly - 3 reps - 10-20sec hold  ASSESSMENT:  CLINICAL IMPRESSION: Patient is a 38 y.o. female who was seen today for physical therapy evaluation and treatment for recent bil mastectomy due to BRCA1 positive status.  Pt is doing very well with return to PLOF and baseline. She has slight tightness with her expanders in general but is doing very well.  No futher PT needs.     OBJECTIVE IMPAIRMENTS: decreased knowledge of condition and impaired flexibility.   ACTIVITY LIMITATIONS: lifting  PARTICIPATION LIMITATIONS: occupation  PERSONAL FACTORS: none are also affecting patient's functional outcome.   REHAB POTENTIAL: Excellent  CLINICAL DECISION MAKING: Stable/uncomplicated  EVALUATION COMPLEXITY: Low  GOALS: Goals reviewed with patient? Yes  SHORT TERM GOALS: Target date: 01/21/24  Pt will be ind with stretches for continued mobility  Baseline: Goal status: MET  2.  Pt will be educated on scar massage and care Baseline:  Goal status: MET  PLAN:  PT FREQUENCY: one time visit  PT DURATION: 1 week  PLANNED INTERVENTIONS: 97110-Therapeutic exercises,  97535- Self Care, 35573- Manual therapy, and Patient/Family education  PLAN FOR NEXT SESSION: return as needed   Idamae Lusher, PT 01/21/2024, 8:28 AM

## 2024-01-21 ENCOUNTER — Ambulatory Visit: Payer: BC Managed Care – PPO | Attending: General Surgery | Admitting: Rehabilitation

## 2024-01-21 ENCOUNTER — Encounter: Payer: Self-pay | Admitting: Rehabilitation

## 2024-01-21 DIAGNOSIS — M25611 Stiffness of right shoulder, not elsewhere classified: Secondary | ICD-10-CM | POA: Diagnosis not present

## 2024-01-21 DIAGNOSIS — Z1509 Genetic susceptibility to other malignant neoplasm: Secondary | ICD-10-CM | POA: Insufficient documentation

## 2024-01-21 DIAGNOSIS — Z1501 Genetic susceptibility to malignant neoplasm of breast: Secondary | ICD-10-CM | POA: Insufficient documentation

## 2024-01-21 DIAGNOSIS — M25612 Stiffness of left shoulder, not elsewhere classified: Secondary | ICD-10-CM | POA: Insufficient documentation

## 2024-01-21 DIAGNOSIS — Z9013 Acquired absence of bilateral breasts and nipples: Secondary | ICD-10-CM | POA: Insufficient documentation

## 2024-01-26 ENCOUNTER — Ambulatory Visit: Payer: BC Managed Care – PPO | Admitting: Physical Therapy

## 2024-01-26 DIAGNOSIS — Z1501 Genetic susceptibility to malignant neoplasm of breast: Secondary | ICD-10-CM | POA: Diagnosis not present

## 2024-01-26 DIAGNOSIS — Z9013 Acquired absence of bilateral breasts and nipples: Secondary | ICD-10-CM | POA: Diagnosis not present

## 2024-01-26 DIAGNOSIS — Z1509 Genetic susceptibility to other malignant neoplasm: Secondary | ICD-10-CM | POA: Diagnosis not present

## 2024-03-02 DIAGNOSIS — Z9013 Acquired absence of bilateral breasts and nipples: Secondary | ICD-10-CM | POA: Diagnosis not present

## 2024-03-02 DIAGNOSIS — Z1501 Genetic susceptibility to malignant neoplasm of breast: Secondary | ICD-10-CM | POA: Diagnosis not present

## 2024-03-02 DIAGNOSIS — Z1509 Genetic susceptibility to other malignant neoplasm: Secondary | ICD-10-CM | POA: Diagnosis not present

## 2024-03-02 NOTE — H&P (Signed)
 Subjective Patient ID: Leah Davis is a 38 y.o. female.     HPI   2.5 mo post op bilateral mastectomies. Scheduled for implant exchange later this month.   Diagnosed with BRCA1 mutation 2013 following mother's diagnosis of BRCA1 mutation. Mother and MGM with breast ca. Mother had abdominal based reconstruction.    MRI 12/2022 demonstrated a 1.5 cm area NME lower outer left breast. MR guided biopsy showed PASH and 6 month follow up MRI recommended. This was completed 8.24 and was benign.   MMG bilateral diagnostic/left [place] 03/2023 benign.    Final pathology benign.  Prior 36 B, desires bit larger. 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 Thinks they are done having kids. Completed IVF in past and still has embryos.    Review of Systems   Objective Physical Exam  Cardiovascular: Normal rate, regular rhythm and normal heart sounds.    Pulmonary/Chest Effort normal and breath sounds normal.      Chest:  bilateral chest expanded, right nipple adherent to capsule left nipple able to pull away from capsule/expander Sn to nipple R 22 L 22 cm Midline to nipple R 11 L 11 cm Nipple to IMF R 4.5 cm to scar, 6 cm to fold, L 5.5 cm to scar 6 cm to fold     Assessment/Plan BRCA1 positive S/p bilateral NSM, prepectoral TE/ADM (Alloderm) reconstruction  Reviewed saline vs silicone, shaped v round. As in prepectoral position I recommend HCG or capacity filled silicone implants to reduce risk visible rippling. Reviewed imaging surveillance for rupture with silicone implants. Reviewed examples for 4th generation, capacity filled 4th generation, and HCG implants vs saline implants. Counseled cannot assure cupe size, implant selection determined in part by width chest.  Discussed in setting saline rupture can leave shell in place, can rupture opposite implant if no further reconstruction desired in future. Discussed intra and extracapsular  rupture. Patient has elected for silicone. Plan smooth round.  Completed physician patient checklist for Leah Davis.    Reviewed fat grafting to aid with contour and reduce rippling. Reviewed variable take graft, may need to repeat, fat necrosis that presents as palpable masses. Reviewed donor site pain and need for compression. Plan to defer this.    Notes right nipple appears different from left. Counseled right has less soft tissue beneath it and scarred/tethered to capsule. Plan to try to release this scar, but understands may adhere again in this area. If she pursues fat grafting now or in future could place fat in this area to try to prevent re tethering.    Additional risks including but not limited to bleeding, hematoma, seroma, infection, asymmetry, unacceptable cosmetic result, damage to adjacent structures, blood clots in legs or lungs reviewed.     Rx for robaxin and bactrim given. Patient has oxycodone at home if needed.   Leah Fellows, MD [facility]  Office/ physician access line after hours [phone]     Natrelle [id number] 400 ml tissue expanders placed bilateral.  RIGHT fill volume 380 ml saline LEFT fill volume 360 ml saline

## 2024-03-08 ENCOUNTER — Encounter (HOSPITAL_BASED_OUTPATIENT_CLINIC_OR_DEPARTMENT_OTHER): Payer: Self-pay | Admitting: Plastic Surgery

## 2024-03-08 ENCOUNTER — Other Ambulatory Visit: Payer: Self-pay

## 2024-03-08 MED ORDER — CHLORHEXIDINE GLUCONATE CLOTH 2 % EX PADS: 6.0000 | MEDICATED_PAD | Freq: Once | CUTANEOUS | Status: DC

## 2024-03-13 NOTE — Anesthesia Preprocedure Evaluation (Signed)
 Anesthesia Evaluation  Patient identified by MRN, date of birth, ID band Patient awake    Reviewed: Allergy & Precautions, NPO status , Patient's Chart, lab work & pertinent test results  History of Anesthesia Complications Negative for: history of anesthetic complications  Airway Mallampati: II  TM Distance: >3 FB Neck ROM: Full   Comment: Previous grade I view with MAC 3, easy mask Dental  (+) Dental Advisory Given, Teeth Intact   Pulmonary neg pulmonary ROS   Pulmonary exam normal breath sounds clear to auscultation       Cardiovascular negative cardio ROS  Rhythm:Regular Rate:Normal     Neuro/Psych  Headaches, neg Seizures PSYCHIATRIC DISORDERS Anxiety Depression       GI/Hepatic negative GI ROS, Neg liver ROS,,,  Endo/Other  negative endocrine ROS    Renal/GU negative Renal ROS     Musculoskeletal   Abdominal   Peds  Hematology negative hematology ROS (+)   Anesthesia Other Findings   Reproductive/Obstetrics BRCA1 positive, h/o left breast IDC                              Anesthesia Physical Anesthesia Plan  ASA: 2  Anesthesia Plan: General   Post-op Pain Management: Tylenol PO (pre-op)*   Induction: Intravenous  PONV Risk Score and Plan: 4 or greater and Ondansetron, Dexamethasone, Midazolam, Scopolamine patch - Pre-op and Treatment may vary due to age or medical condition  Airway Management Planned: Oral ETT  Additional Equipment:   Intra-op Plan:   Post-operative Plan: Extubation in OR  Informed Consent: I have reviewed the patients History and Physical, chart, labs and discussed the procedure including the risks, benefits and alternatives for the proposed anesthesia with the patient or authorized representative who has indicated his/her understanding and acceptance.     Dental advisory given  Plan Discussed with: Anesthesiologist and CRNA  Anesthesia Plan  Comments: (Risks of general anesthesia discussed including, but not limited to, sore throat, hoarse voice, chipped/damaged teeth, injury to vocal cords, nausea and vomiting, allergic reactions, lung infection, heart attack, stroke, and death. All questions answered. )         Anesthesia Quick Evaluation

## 2024-03-14 ENCOUNTER — Ambulatory Visit (HOSPITAL_BASED_OUTPATIENT_CLINIC_OR_DEPARTMENT_OTHER)
Admission: RE | Admit: 2024-03-14 | Discharge: 2024-03-14 | Disposition: A | Discharge status: Home / Self Care | Attending: Plastic Surgery | Admitting: Plastic Surgery

## 2024-03-14 ENCOUNTER — Encounter (HOSPITAL_BASED_OUTPATIENT_CLINIC_OR_DEPARTMENT_OTHER): Payer: Self-pay | Admitting: Plastic Surgery

## 2024-03-14 ENCOUNTER — Ambulatory Visit (HOSPITAL_BASED_OUTPATIENT_CLINIC_OR_DEPARTMENT_OTHER): Payer: Self-pay | Admitting: Anesthesiology

## 2024-03-14 ENCOUNTER — Encounter (HOSPITAL_BASED_OUTPATIENT_CLINIC_OR_DEPARTMENT_OTHER)
Admission: RE | Disposition: A | Discharge status: Home / Self Care | Payer: Self-pay | Source: Home / Self Care | Attending: Plastic Surgery

## 2024-03-14 ENCOUNTER — Other Ambulatory Visit: Payer: Self-pay

## 2024-03-14 DIAGNOSIS — R519 Headache, unspecified: Secondary | ICD-10-CM | POA: Insufficient documentation

## 2024-03-14 DIAGNOSIS — Z1501 Genetic susceptibility to malignant neoplasm of breast: Secondary | ICD-10-CM | POA: Diagnosis not present

## 2024-03-14 DIAGNOSIS — F419 Anxiety disorder, unspecified: Secondary | ICD-10-CM | POA: Insufficient documentation

## 2024-03-14 DIAGNOSIS — Z421 Encounter for breast reconstruction following mastectomy: Secondary | ICD-10-CM | POA: Insufficient documentation

## 2024-03-14 DIAGNOSIS — Z1509 Genetic susceptibility to other malignant neoplasm: Secondary | ICD-10-CM | POA: Diagnosis not present

## 2024-03-14 DIAGNOSIS — F32A Depression, unspecified: Secondary | ICD-10-CM | POA: Insufficient documentation

## 2024-03-14 DIAGNOSIS — Z9013 Acquired absence of bilateral breasts and nipples: Secondary | ICD-10-CM | POA: Diagnosis not present

## 2024-03-14 DIAGNOSIS — Z01818 Encounter for other preprocedural examination: Secondary | ICD-10-CM

## 2024-03-14 HISTORY — PX: REMOVAL OF BILATERAL TISSUE EXPANDERS WITH PLACEMENT OF BILATERAL BREAST IMPLANTS: SHX6431

## 2024-03-14 HISTORY — DX: Depression, unspecified: F32.A

## 2024-03-14 LAB — POCT PREGNANCY, URINE: Preg Test, Ur: NEGATIVE

## 2024-03-14 SURGERY — REMOVAL, TISSUE EXPANDER, BREAST, BILATERAL, WITH BILATERAL IMPLANT IMPLANT INSERTION
Anesthesia: General | Site: Chest | Laterality: Bilateral

## 2024-03-14 MED ORDER — LACTATED RINGERS IV SOLN: INTRAVENOUS | Status: DC

## 2024-03-14 MED ORDER — MIDAZOLAM HCL 5 MG/5ML IJ SOLN
INTRAMUSCULAR | Status: DC | PRN
  Administered 2024-03-14: 2 mg via INTRAVENOUS

## 2024-03-14 MED ORDER — LIDOCAINE 2% (20 MG/ML) 5 ML SYRINGE
INTRAMUSCULAR | Status: AC
  Filled 2024-03-14: qty 5

## 2024-03-14 MED ORDER — FENTANYL CITRATE (PF) 100 MCG/2ML IJ SOLN
INTRAMUSCULAR | Status: AC
Start: 2024-03-14 — End: ?
  Filled 2024-03-14: qty 2

## 2024-03-14 MED ORDER — ACETAMINOPHEN 500 MG PO TABS
ORAL_TABLET | ORAL | Status: AC
  Filled 2024-03-14: qty 2

## 2024-03-14 MED ORDER — MIDAZOLAM HCL 2 MG/2ML IJ SOLN
INTRAMUSCULAR | Status: AC
  Filled 2024-03-14: qty 2

## 2024-03-14 MED ORDER — LIDOCAINE HCL (CARDIAC) PF 100 MG/5ML IV SOSY
PREFILLED_SYRINGE | INTRAVENOUS | Status: DC | PRN
  Administered 2024-03-14: 100 mg via INTRAVENOUS

## 2024-03-14 MED ORDER — OXYCODONE HCL 5 MG PO TABS
5.0000 mg | ORAL_TABLET | Freq: Once | ORAL | Status: AC | PRN
  Administered 2024-03-14: 5 mg via ORAL

## 2024-03-14 MED ORDER — AMISULPRIDE (ANTIEMETIC) 5 MG/2ML IV SOLN: 10.0000 mg | Freq: Once | INTRAVENOUS | Status: DC | PRN

## 2024-03-14 MED ORDER — SUGAMMADEX SODIUM 200 MG/2ML IV SOLN
INTRAVENOUS | Status: DC | PRN
  Administered 2024-03-14: 200 mg via INTRAVENOUS

## 2024-03-14 MED ORDER — PROPOFOL 10 MG/ML IV BOLUS
INTRAVENOUS | Status: AC
  Filled 2024-03-14: qty 20

## 2024-03-14 MED ORDER — FENTANYL CITRATE (PF) 100 MCG/2ML IJ SOLN
INTRAMUSCULAR | Status: DC | PRN
  Administered 2024-03-14 (×2): 50 ug via INTRAVENOUS
  Administered 2024-03-14: 100 ug via INTRAVENOUS

## 2024-03-14 MED ORDER — GABAPENTIN 300 MG PO CAPS
300.0000 mg | ORAL_CAPSULE | ORAL | Status: AC
  Administered 2024-03-14: 300 mg via ORAL

## 2024-03-14 MED ORDER — FENTANYL CITRATE (PF) 100 MCG/2ML IJ SOLN
INTRAMUSCULAR | Status: AC
  Filled 2024-03-14: qty 2

## 2024-03-14 MED ORDER — SCOPOLAMINE 1 MG/3DAYS TD PT72
1.0000 | MEDICATED_PATCH | TRANSDERMAL | Status: DC
  Administered 2024-03-14: 1.5 mg via TRANSDERMAL

## 2024-03-14 MED ORDER — BUPIVACAINE HCL (PF) 0.5 % IJ SOLN
INTRAMUSCULAR | Status: DC | PRN
  Administered 2024-03-14: 30 mL

## 2024-03-14 MED ORDER — ACETAMINOPHEN 500 MG PO TABS
1000.0000 mg | ORAL_TABLET | ORAL | Status: AC
  Administered 2024-03-14: 1000 mg via ORAL

## 2024-03-14 MED ORDER — DEXAMETHASONE SODIUM PHOSPHATE 10 MG/ML IJ SOLN
INTRAMUSCULAR | Status: AC
  Filled 2024-03-14: qty 1

## 2024-03-14 MED ORDER — FENTANYL CITRATE (PF) 100 MCG/2ML IJ SOLN
25.0000 ug | INTRAMUSCULAR | Status: DC | PRN
  Administered 2024-03-14 (×2): 25 ug via INTRAVENOUS
  Administered 2024-03-14: 50 ug via INTRAVENOUS

## 2024-03-14 MED ORDER — SCOPOLAMINE 1 MG/3DAYS TD PT72
MEDICATED_PATCH | TRANSDERMAL | Status: AC
  Filled 2024-03-14: qty 1

## 2024-03-14 MED ORDER — ONDANSETRON HCL 4 MG/2ML IJ SOLN
INTRAMUSCULAR | Status: AC
  Filled 2024-03-14: qty 2

## 2024-03-14 MED ORDER — GABAPENTIN 300 MG PO CAPS
ORAL_CAPSULE | ORAL | Status: AC
  Filled 2024-03-14: qty 1

## 2024-03-14 MED ORDER — PROPOFOL 10 MG/ML IV BOLUS
INTRAVENOUS | Status: DC | PRN
  Administered 2024-03-14: 160 mg via INTRAVENOUS

## 2024-03-14 MED ORDER — ROCURONIUM BROMIDE 100 MG/10ML IV SOLN
INTRAVENOUS | Status: DC | PRN
  Administered 2024-03-14: 20 mg via INTRAVENOUS
  Administered 2024-03-14: 60 mg via INTRAVENOUS

## 2024-03-14 MED ORDER — CELECOXIB 200 MG PO CAPS
200.0000 mg | ORAL_CAPSULE | ORAL | Status: AC
Start: 2024-03-14 — End: 2024-03-14
  Administered 2024-03-14: 200 mg via ORAL

## 2024-03-14 MED ORDER — SODIUM CHLORIDE 0.9 % IV SOLN
INTRAVENOUS | Status: DC | PRN
  Administered 2024-03-14: 500 mL

## 2024-03-14 MED ORDER — BUPIVACAINE HCL (PF) 0.5 % IJ SOLN
INTRAMUSCULAR | Status: AC
  Filled 2024-03-14: qty 30

## 2024-03-14 MED ORDER — CEFAZOLIN SODIUM-DEXTROSE 2-4 GM/100ML-% IV SOLN
2.0000 g | INTRAVENOUS | Status: AC
  Administered 2024-03-14: 2 g via INTRAVENOUS

## 2024-03-14 MED ORDER — CEFAZOLIN SODIUM-DEXTROSE 2-4 GM/100ML-% IV SOLN
INTRAVENOUS | Status: AC
  Filled 2024-03-14: qty 100

## 2024-03-14 MED ORDER — DEXAMETHASONE SODIUM PHOSPHATE 4 MG/ML IJ SOLN
INTRAMUSCULAR | Status: DC | PRN
  Administered 2024-03-14: 5 mg via INTRAVENOUS

## 2024-03-14 MED ORDER — OXYCODONE HCL 5 MG/5ML PO SOLN: 5.0000 mg | Freq: Once | ORAL | Status: AC | PRN

## 2024-03-14 MED ORDER — ROCURONIUM BROMIDE 10 MG/ML (PF) SYRINGE
PREFILLED_SYRINGE | INTRAVENOUS | Status: AC
  Filled 2024-03-14: qty 10

## 2024-03-14 MED ORDER — OXYCODONE HCL 5 MG PO TABS
ORAL_TABLET | ORAL | Status: AC
  Filled 2024-03-14: qty 1

## 2024-03-14 MED ORDER — SODIUM CHLORIDE 0.9 % IV SOLN
INTRAVENOUS | Status: AC
  Filled 2024-03-14: qty 10

## 2024-03-14 MED ORDER — CELECOXIB 200 MG PO CAPS
ORAL_CAPSULE | ORAL | Status: AC
  Filled 2024-03-14: qty 1

## 2024-03-14 MED ORDER — ONDANSETRON HCL 4 MG/2ML IJ SOLN
INTRAMUSCULAR | Status: DC | PRN
  Administered 2024-03-14: 4 mg via INTRAVENOUS

## 2024-03-14 SURGICAL SUPPLY — 59 items
BAG DECANTER FOR FLEXI CONT (MISCELLANEOUS) ×3 IMPLANT
BINDER BREAST LRG (GAUZE/BANDAGES/DRESSINGS) IMPLANT
BINDER BREAST XLRG (GAUZE/BANDAGES/DRESSINGS) IMPLANT
BINDER BREAST XXLRG (GAUZE/BANDAGES/DRESSINGS) IMPLANT
BLADE SURG 10 STRL SS (BLADE) ×6 IMPLANT
BNDG GAUZE DERMACEA FLUFF 4 (GAUZE/BANDAGES/DRESSINGS) ×6 IMPLANT
CANISTER SUCT 1200ML W/VALVE (MISCELLANEOUS) ×3 IMPLANT
CHLORAPREP W/TINT 26 (MISCELLANEOUS) ×6 IMPLANT
COVER BACK TABLE 60X90IN (DRAPES) ×3 IMPLANT
COVER MAYO STAND STRL (DRAPES) ×3 IMPLANT
DERMABOND ADVANCED .7 DNX12 (GAUZE/BANDAGES/DRESSINGS) ×6 IMPLANT
DRAIN CHANNEL 15F RND FF W/TCR (WOUND CARE) IMPLANT
DRAPE TOP ARMCOVERS (MISCELLANEOUS) ×3 IMPLANT
DRAPE U-SHAPE 76X120 STRL (DRAPES) ×3 IMPLANT
DRAPE UTILITY XL STRL (DRAPES) ×3 IMPLANT
ELECT BLADE 4.0 EZ CLEAN MEGAD (MISCELLANEOUS) IMPLANT
ELECT COATED BLADE 2.86 ST (ELECTRODE) ×3 IMPLANT
ELECT REM PT RETURN 9FT ADLT (ELECTROSURGICAL) ×2 IMPLANT
ELECTRODE BLDE 4.0 EZ CLN MEGD (MISCELLANEOUS) IMPLANT
ELECTRODE REM PT RTRN 9FT ADLT (ELECTROSURGICAL) ×3 IMPLANT
EVACUATOR SILICONE 100CC (DRAIN) IMPLANT
GAUZE PAD ABD 8X10 STRL (GAUZE/BANDAGES/DRESSINGS) ×6 IMPLANT
GLOVE BIO SURGEON STRL SZ 6 (GLOVE) ×6 IMPLANT
GOWN STRL REUS W/ TWL LRG LVL3 (GOWN DISPOSABLE) ×6 IMPLANT
IMPL BREAST P6.1XRND LO 495 (Breast) ×1 IMPLANT
IMPL BREAST P6XRND 420 (Breast) ×1 IMPLANT
IMPL BRST P6.1XRND LO 495CC (Breast) ×2 IMPLANT
IMPL BRST P6XRND 445CC (Breast) ×2 IMPLANT
KIT FILL ASEPTIC TRANSFER (MISCELLANEOUS) IMPLANT
MARKER SKIN DUAL TIP RULER LAB (MISCELLANEOUS) IMPLANT
NDL FILTER BLUNT 18X1 1/2 (NEEDLE) IMPLANT
NDL HYPO 25X1 1.5 SAFETY (NEEDLE) IMPLANT
NEEDLE FILTER BLUNT 18X1 1/2 (NEEDLE) IMPLANT
NEEDLE HYPO 25X1 1.5 SAFETY (NEEDLE) IMPLANT
PACK BASIN DAY SURGERY FS (CUSTOM PROCEDURE TRAY) ×3 IMPLANT
PENCIL SMOKE EVACUATOR (MISCELLANEOUS) ×3 IMPLANT
PIN SAFETY STERILE (MISCELLANEOUS) IMPLANT
SHEET MEDIUM DRAPE 40X70 STRL (DRAPES) ×3 IMPLANT
SIZER BREAST 495CC (SIZER) ×2 IMPLANT
SIZER BRST 495CC (SIZER) ×1 IMPLANT
SIZER BRST P6.1X 470CC (SIZER) ×1 IMPLANT
SIZER BRSTX STRL LF SIL 445CC (SIZER) ×1 IMPLANT
SLEEVE SCD COMPRESS KNEE MED (STOCKING) ×3 IMPLANT
SPIKE FLUID TRANSFER (MISCELLANEOUS) IMPLANT
SPONGE T-LAP 18X18 ~~LOC~~+RFID (SPONGE) ×4 IMPLANT
STAPLER SKIN PROX WIDE 3.9 (STAPLE) ×3 IMPLANT
SUT ETHILON 2 0 FS 18 (SUTURE) IMPLANT
SUT MNCRL AB 4-0 PS2 18 (SUTURE) IMPLANT
SUT PDS AB 2-0 CT2 27 (SUTURE) IMPLANT
SUT VIC AB 3-0 PS1 18XBRD (SUTURE) IMPLANT
SUT VIC AB 3-0 SH 27X BRD (SUTURE) IMPLANT
SUT VIC AB 4-0 PS2 18 (SUTURE) IMPLANT
SYR 20ML LL LF (SYRINGE) IMPLANT
SYR BULB IRRIG 60ML STRL (SYRINGE) ×6 IMPLANT
SYR CONTROL 10ML LL (SYRINGE) IMPLANT
TOWEL GREEN STERILE FF (TOWEL DISPOSABLE) ×6 IMPLANT
TUBE CONNECTING 20X1/4 (TUBING) ×3 IMPLANT
UNDERPAD 30X36 HEAVY ABSORB (UNDERPADS AND DIAPERS) ×6 IMPLANT
YANKAUER SUCT BULB TIP NO VENT (SUCTIONS) ×3 IMPLANT

## 2024-03-14 NOTE — Discharge Instructions (Signed)
 No Tylenol before 1230pm today. No Ibuprofen/NSAIDS before 2:30pm today.   Post Anesthesia Home Care Instructions  Activity: Get plenty of rest for the remainder of the day. A responsible individual must stay with you for 24 hours following the procedure.  For the next 24 hours, DO NOT: -Drive a car -Advertising copywriter -Drink alcoholic beverages -Take any medication unless instructed by your physician -Make any legal decisions or sign important papers.  Meals: Start with liquid foods such as gelatin or soup. Progress to regular foods as tolerated. Avoid greasy, spicy, heavy foods. If nausea and/or vomiting occur, drink only clear liquids until the nausea and/or vomiting subsides. Call your physician if vomiting continues.  Special Instructions/Symptoms: Your throat may feel dry or sore from the anesthesia or the breathing tube placed in your throat during surgery. If this causes discomfort, gargle with warm salt water. The discomfort should disappear within 24 hours.  If you had a scopolamine patch placed behind your ear for the management of post- operative nausea and/or vomiting:  1. The medication in the patch is effective for 72 hours, after which it should be removed.  Wrap patch in a tissue and discard in the trash. Wash hands thoroughly with soap and water. 2. You may remove the patch earlier than 72 hours if you experience unpleasant side effects which may include dry mouth, dizziness or visual disturbances. 3. Avoid touching the patch. Wash your hands with soap and water after contact with the patch.

## 2024-03-14 NOTE — Anesthesia Procedure Notes (Signed)
 Procedure Name: Intubation Date/Time: 03/14/2024 7:38 AM  Performed by: Lucky Sable, CRNAPre-anesthesia Checklist: Patient identified, Emergency Drugs available, Suction available, Patient being monitored and Timeout performed Patient Re-evaluated:Patient Re-evaluated prior to induction Oxygen Delivery Method: Circle system utilized Preoxygenation: Pre-oxygenation with 100% oxygen Induction Type: IV induction Ventilation: Mask ventilation without difficulty Laryngoscope Size: Mac and 3 Grade View: Grade II Tube type: Oral Tube size: 7.0 mm Number of attempts: 1 Airway Equipment and Method: Stylet and Oral airway Placement Confirmation: ETT inserted through vocal cords under direct vision, positive ETCO2, breath sounds checked- equal and bilateral and CO2 detector Secured at: 24 cm Tube secured with: Tape Dental Injury: Teeth and Oropharynx as per pre-operative assessment

## 2024-03-14 NOTE — Interval H&P Note (Signed)
 History and Physical Interval Note:  03/14/2024 6:58 AM  Leah Davis  has presented today for surgery, with the diagnosis of BRAC1 ACQUIRED ABSENCE BREASTS.  The various methods of treatment have been discussed with the patient and family. After consideration of risks, benefits and other options for treatment, the patient has consented to  removal bilateral chest expanders and placement silicone implants as a surgical intervention.  The patient's history has been reviewed, patient examined, no change in status, stable for surgery.  I have reviewed the patient's chart and labs.  Questions were answered to the patient's satisfaction.     Lisabeth Rider Lukas Pelcher

## 2024-03-14 NOTE — Op Note (Signed)
 Operative Note   DATE OF OPERATION: 4.15.2025  LOCATION: [facility]-outpatient  SURGICAL DIVISION: Plastic Surgery  PREOPERATIVE DIAGNOSES:  1. BRCA1 2. Acquired absence breasts  POSTOPERATIVE DIAGNOSES:  same  PROCEDURE:  Removal bilateral chest tissue expanders and placement silicone implants  SURGEON: Alger Infield MD MBA  ASSISTANT: none  ANESTHESIA:  General.   EBL: 10 ml  COMPLICATIONS: None immediate.   INDICATIONS FOR PROCEDURE:  The patient, Leah Davis, is a 38 y.o. female born on 03-24-1986, is here for staged breast reconstruction following nipple sparing mastectomies and prepectoral tissue expander based reconstruction.   FINDINGS: Complete incorporation ADM noted bilateral. Natrelle Soft Touch smooth round Extra Projection implant placed bilateral. RIGHT 495 ml REF SSX-495 [id number] LEFT 445 ml REF [id number]  DESCRIPTION OF PROCEDURE:  The patient's operative site was marked with the patient in the preoperative area. The patient was taken to the operating room. SCDs were placed and IV antibiotics were given. The patient's operative site was prepped and draped in a sterile fashion. A time out was performed and all information was confirmed to be correct. Incision made in left inframammary fold and carried through superficial fascia and acellular dermis. Expander removed. Superior capsulotomy performed. Sizer placed. Over right chest, incision made in inframammary fold scar and carried through superficial fascia and acellular dermis. Expander removed. Superior capsulotomy performed. Sizer placed. Patient brought to upright sitting position. An Extra Projection 445 ml implant selected for right chest and 495 ml implant selected for left chest placement. Patient returned to supine position.    Local anesthetic infiltrated through bilateral implant cavities. Right chest cavity irrigated with saline solution containing Ancef, gentamicin, and Betadine.  Hemostasis ensured. The implant was placed in right chest, and implant orientation ensured. Closure completed with 3-0 vicryl to close superficial fascia and ADM over implant. 4-0 vicryl used to close dermis followed by 4-0 monocryl subcuticular. Over left chest, cavity irrigated with saline solution containing Ancef, gentamicin, and Betadine. Hemostasis ensured. Implant placed in left chest cavity, and implant orientation ensured. Closure completed with 3-0 vicryl in superficial fascia and ADM, 4-0 vicryl in dermis, and 4-0 monocryl subcuticular skin closure. Dermabond applied to incisions. Dry dressing, followed by breast binder applied.   The patient was allowed to wake from anesthesia, extubated and taken to the recovery room in satisfactory condition.   SPECIMENS: none  DRAINS: none  Alger Infield, MD [facility] Plastic & Reconstructive Surgery  Office/ physician access line after hours [phone]

## 2024-03-14 NOTE — Transfer of Care (Signed)
 Immediate Anesthesia Transfer of Care Note  Patient: Leah Davis  Procedure(s) Performed: Procedure(s) (LRB): REMOVAL, TISSUE EXPANDER, BREAST, BILATERAL, WITH BILATERAL IMPLANT IMPLANT INSERTION (Bilateral)  Patient Location: PACU  Anesthesia Type: GA  Level of Consciousness: awake, sedated, patient cooperative and responds to stimulation, sleepy stable   Airway & Oxygen Therapy: Patient Spontanous Breathing and Patient connected to [place] oxygen  Post-op Assessment: Report given to PACU RN, Post -op Vital signs reviewed and stable and Patient sleepy   Post vital signs: Reviewed and stable  Complications: No apparent anesthesia complications

## 2024-03-14 NOTE — Anesthesia Postprocedure Evaluation (Signed)
 Anesthesia Post Note  Patient: Leah Davis  Procedure(s) Performed: REMOVAL, TISSUE EXPANDER, BREAST, BILATERAL, WITH BILATERAL IMPLANT IMPLANT INSERTION (Bilateral: Chest)     Patient location during evaluation: PACU Anesthesia Type: General Level of consciousness: awake Pain management: pain level controlled Vital Signs Assessment: post-procedure vital signs reviewed and stable Respiratory status: spontaneous breathing, nonlabored ventilation and respiratory function stable Cardiovascular status: blood pressure returned to baseline and stable Postop Assessment: no apparent nausea or vomiting Anesthetic complications: no   No notable events documented.  Last Vitals:  Vitals:   03/14/24 0930 03/14/24 0945  BP: (!) 148/91 (!) 148/90  Pulse: 84 90  Resp: 15 20  Temp:    SpO2: 100% 97%    Last Pain:  Vitals:   03/14/24 0945  TempSrc:   PainSc: 2                  Conard Decent

## 2024-03-17 ENCOUNTER — Encounter (HOSPITAL_BASED_OUTPATIENT_CLINIC_OR_DEPARTMENT_OTHER): Payer: Self-pay | Admitting: Plastic Surgery

## 2024-03-22 DIAGNOSIS — Z1509 Genetic susceptibility to other malignant neoplasm: Secondary | ICD-10-CM | POA: Diagnosis not present

## 2024-03-22 DIAGNOSIS — Z1501 Genetic susceptibility to malignant neoplasm of breast: Secondary | ICD-10-CM | POA: Diagnosis not present

## 2024-03-22 DIAGNOSIS — Z9013 Acquired absence of bilateral breasts and nipples: Secondary | ICD-10-CM | POA: Diagnosis not present

## 2024-04-13 DIAGNOSIS — Z1501 Genetic susceptibility to malignant neoplasm of breast: Secondary | ICD-10-CM | POA: Diagnosis not present

## 2024-04-13 DIAGNOSIS — Z1509 Genetic susceptibility to other malignant neoplasm: Secondary | ICD-10-CM | POA: Diagnosis not present

## 2024-04-13 DIAGNOSIS — Z9013 Acquired absence of bilateral breasts and nipples: Secondary | ICD-10-CM | POA: Diagnosis not present

## 2024-04-21 DIAGNOSIS — L700 Acne vulgaris: Secondary | ICD-10-CM | POA: Diagnosis not present

## 2024-05-25 DIAGNOSIS — Z9013 Acquired absence of bilateral breasts and nipples: Secondary | ICD-10-CM | POA: Diagnosis not present

## 2024-05-25 DIAGNOSIS — Z1509 Genetic susceptibility to other malignant neoplasm: Secondary | ICD-10-CM | POA: Diagnosis not present

## 2024-05-25 DIAGNOSIS — Z1501 Genetic susceptibility to malignant neoplasm of breast: Secondary | ICD-10-CM | POA: Diagnosis not present

## 2024-06-16 DIAGNOSIS — L738 Other specified follicular disorders: Secondary | ICD-10-CM | POA: Diagnosis not present

## 2024-06-16 DIAGNOSIS — L700 Acne vulgaris: Secondary | ICD-10-CM | POA: Diagnosis not present

## 2024-07-19 ENCOUNTER — Telehealth: Payer: Self-pay | Admitting: *Deleted

## 2024-07-19 NOTE — Telephone Encounter (Signed)
 Received call from pt with complaint of heavy menstrual bleeding requesting advice from MD for birth control options. Per MD, due to pt hx of BRCA positive gene mutation, he does not recommend birth control at this time.  Pt states she is done having children and MD advises pt to undergo hysterectomy or partial hysterectomy and keeping ovaries until the age of 89.  Pt educated and states she will discuss with her OBGYN.

## 2024-07-28 DIAGNOSIS — N926 Irregular menstruation, unspecified: Secondary | ICD-10-CM | POA: Diagnosis not present

## 2024-07-28 DIAGNOSIS — Z1501 Genetic susceptibility to malignant neoplasm of breast: Secondary | ICD-10-CM | POA: Diagnosis not present

## 2024-07-28 DIAGNOSIS — N859 Noninflammatory disorder of uterus, unspecified: Secondary | ICD-10-CM | POA: Diagnosis not present

## 2024-07-28 DIAGNOSIS — Z3202 Encounter for pregnancy test, result negative: Secondary | ICD-10-CM | POA: Diagnosis not present

## 2024-08-03 DIAGNOSIS — M545 Low back pain, unspecified: Secondary | ICD-10-CM | POA: Diagnosis not present

## 2024-08-03 DIAGNOSIS — R030 Elevated blood-pressure reading, without diagnosis of hypertension: Secondary | ICD-10-CM | POA: Diagnosis not present

## 2024-08-03 DIAGNOSIS — M5489 Other dorsalgia: Secondary | ICD-10-CM | POA: Diagnosis not present

## 2024-08-11 DIAGNOSIS — E782 Mixed hyperlipidemia: Secondary | ICD-10-CM | POA: Diagnosis not present

## 2024-08-11 DIAGNOSIS — Z23 Encounter for immunization: Secondary | ICD-10-CM | POA: Diagnosis not present

## 2024-08-11 DIAGNOSIS — F419 Anxiety disorder, unspecified: Secondary | ICD-10-CM | POA: Diagnosis not present

## 2024-08-11 DIAGNOSIS — G43009 Migraine without aura, not intractable, without status migrainosus: Secondary | ICD-10-CM | POA: Diagnosis not present

## 2024-08-11 DIAGNOSIS — Z0000 Encounter for general adult medical examination without abnormal findings: Secondary | ICD-10-CM | POA: Diagnosis not present

## 2024-08-21 DIAGNOSIS — N939 Abnormal uterine and vaginal bleeding, unspecified: Secondary | ICD-10-CM | POA: Diagnosis not present

## 2024-10-05 DIAGNOSIS — Z1501 Genetic susceptibility to malignant neoplasm of breast: Secondary | ICD-10-CM | POA: Diagnosis not present

## 2024-10-05 DIAGNOSIS — Z9013 Acquired absence of bilateral breasts and nipples: Secondary | ICD-10-CM | POA: Diagnosis not present

## 2024-10-05 DIAGNOSIS — Z1589 Genetic susceptibility to other disease: Secondary | ICD-10-CM | POA: Diagnosis not present

## 2024-10-05 DIAGNOSIS — Z1509 Genetic susceptibility to other malignant neoplasm: Secondary | ICD-10-CM | POA: Diagnosis not present

## 2024-10-30 DIAGNOSIS — Z3189 Encounter for other procreative management: Secondary | ICD-10-CM | POA: Diagnosis not present
